# Patient Record
Sex: Female | Born: 1990 | Race: Black or African American | Hispanic: No | Smoking: Current every day smoker
Health system: Southern US, Community
[De-identification: ages and names within clinical notes are randomized; demographics above are authoritative.]

## PROBLEM LIST (undated history)

## (undated) DIAGNOSIS — E119 Type 2 diabetes mellitus without complications: Secondary | ICD-10-CM

## (undated) DIAGNOSIS — O139 Gestational [pregnancy-induced] hypertension without significant proteinuria, unspecified trimester: Secondary | ICD-10-CM

## (undated) DIAGNOSIS — Z5189 Encounter for other specified aftercare: Secondary | ICD-10-CM

---

## 2013-07-07 DIAGNOSIS — O1410 Severe pre-eclampsia, unspecified trimester: Secondary | ICD-10-CM

## 2014-07-31 DIAGNOSIS — O1410 Severe pre-eclampsia, unspecified trimester: Secondary | ICD-10-CM

## 2018-08-03 ENCOUNTER — Encounter (HOSPITAL_COMMUNITY): Payer: Self-pay | Admitting: Emergency Medicine

## 2018-08-03 ENCOUNTER — Other Ambulatory Visit: Payer: Self-pay

## 2018-08-03 ENCOUNTER — Emergency Department (HOSPITAL_COMMUNITY): Payer: Medicaid Other

## 2018-08-03 ENCOUNTER — Emergency Department (HOSPITAL_COMMUNITY)
Admission: EM | Admit: 2018-08-03 | Discharge: 2018-08-03 | Disposition: A | Discharge status: Home / Self Care | Payer: Medicaid Other | Attending: Emergency Medicine | Admitting: Emergency Medicine

## 2018-08-03 DIAGNOSIS — M545 Low back pain, unspecified: Secondary | ICD-10-CM

## 2018-08-03 DIAGNOSIS — R112 Nausea with vomiting, unspecified: Secondary | ICD-10-CM

## 2018-08-03 DIAGNOSIS — R079 Chest pain, unspecified: Secondary | ICD-10-CM | POA: Insufficient documentation

## 2018-08-03 DIAGNOSIS — E119 Type 2 diabetes mellitus without complications: Secondary | ICD-10-CM | POA: Diagnosis not present

## 2018-08-03 DIAGNOSIS — I10 Essential (primary) hypertension: Secondary | ICD-10-CM | POA: Diagnosis not present

## 2018-08-03 DIAGNOSIS — F17210 Nicotine dependence, cigarettes, uncomplicated: Secondary | ICD-10-CM | POA: Diagnosis not present

## 2018-08-03 HISTORY — DX: Type 2 diabetes mellitus without complications: E11.9

## 2018-08-03 LAB — COMPREHENSIVE METABOLIC PANEL
ALT: 24 U/L (ref 0–44)
AST: 25 U/L (ref 15–41)
Albumin: 4.5 g/dL (ref 3.5–5.0)
Alkaline Phosphatase: 106 U/L (ref 38–126)
Anion gap: 16 — ABNORMAL HIGH (ref 5–15)
BUN: 11 mg/dL (ref 6–20)
CO2: 24 mmol/L (ref 22–32)
Calcium: 9.6 mg/dL (ref 8.9–10.3)
Chloride: 97 mmol/L — ABNORMAL LOW (ref 98–111)
Creatinine, Ser: 0.97 mg/dL (ref 0.44–1.00)
GFR calc Af Amer: >60 mL/min (ref >60)
GFR calc non Af Amer: >60 mL/min (ref >60)
Glucose, Bld: 308 mg/dL — ABNORMAL HIGH (ref 70–99)
Potassium: 4.1 mmol/L (ref 3.5–5.1)
Sodium: 137 mmol/L (ref 135–145)
Total Bilirubin: 1 mg/dL (ref 0.3–1.2)
Total Protein: 8.7 g/dL — ABNORMAL HIGH (ref 6.5–8.1)

## 2018-08-03 LAB — URINALYSIS, ROUTINE W REFLEX MICROSCOPIC
Bilirubin Urine: NEGATIVE
Glucose, UA: >=500 mg/dL — AB
Ketones, ur: 5 mg/dL — AB
Nitrite: NEGATIVE
Protein, ur: >=300 mg/dL — AB
Specific Gravity, Urine: 1.022 (ref 1.005–1.030)
pH: 7 (ref 5.0–8.0)

## 2018-08-03 LAB — BLOOD GAS, VENOUS
Acid-Base Excess: 0.1 mmol/L (ref 0.0–2.0)
Bicarbonate: 25.4 mmol/L (ref 20.0–28.0)
O2 Saturation: 70.6 %
Patient temperature: 98.6
pCO2, Ven: 45.7 mmHg (ref 44.0–60.0)
pH, Ven: 7.364 (ref 7.250–7.430)
pO2, Ven: 37.3 mmHg (ref 32.0–45.0)

## 2018-08-03 LAB — CBC WITH DIFFERENTIAL/PLATELET
Abs Immature Granulocytes: 0.03 10*3/uL (ref 0.00–0.07)
Basophils Absolute: 0.1 10*3/uL (ref 0.0–0.1)
Basophils Relative: 0 %
Eosinophils Absolute: 0 10*3/uL (ref 0.0–0.5)
Eosinophils Relative: 0 %
HCT: 45.7 % (ref 36.0–46.0)
Hemoglobin: 15.7 g/dL — ABNORMAL HIGH (ref 12.0–15.0)
Immature Granulocytes: 0 %
Lymphocytes Relative: 10 %
Lymphs Abs: 1.2 10*3/uL (ref 0.7–4.0)
MCH: 28.8 pg (ref 26.0–34.0)
MCHC: 34.4 g/dL (ref 30.0–36.0)
MCV: 83.9 fL (ref 80.0–100.0)
Monocytes Absolute: 0.5 10*3/uL (ref 0.1–1.0)
Monocytes Relative: 4 %
Neutro Abs: 10.4 10*3/uL — ABNORMAL HIGH (ref 1.7–7.7)
Neutrophils Relative %: 86 %
Platelets: 312 10*3/uL (ref 150–400)
RBC: 5.45 MIL/uL — ABNORMAL HIGH (ref 3.87–5.11)
RDW: 11.9 % (ref 11.5–15.5)
WBC: 12.2 10*3/uL — ABNORMAL HIGH (ref 4.0–10.5)
nRBC: 0 % (ref 0.0–0.2)

## 2018-08-03 LAB — ETHANOL: Alcohol, Ethyl (B): <10 mg/dL (ref <10)

## 2018-08-03 LAB — I-STAT BETA HCG BLOOD, ED (MC, WL, AP ONLY): I-stat hCG, quantitative: <5 m[IU]/mL (ref <5)

## 2018-08-03 LAB — LIPASE, BLOOD: Lipase: 24 U/L (ref 11–51)

## 2018-08-03 MED ORDER — HYDROMORPHONE HCL 1 MG/ML IJ SOLN
1.0000 mg | Freq: Once | INTRAMUSCULAR | Status: AC
  Administered 2018-08-03: 09:00:00 1 mg via INTRAVENOUS
  Filled 2018-08-03: qty 1

## 2018-08-03 MED ORDER — ACETAMINOPHEN 500 MG PO TABS: 500.0000 mg | ORAL_TABLET | Freq: Four times a day (QID) | ORAL | 0 refills | Status: DC | PRN

## 2018-08-03 MED ORDER — SODIUM CHLORIDE (PF) 0.9 % IJ SOLN
INTRAMUSCULAR | Status: AC
  Filled 2018-08-03: qty 50

## 2018-08-03 MED ORDER — ONDANSETRON 4 MG PO TBDP: 4.0000 mg | ORAL_TABLET | Freq: Three times a day (TID) | ORAL | 0 refills | Status: DC | PRN

## 2018-08-03 MED ORDER — HYDROMORPHONE HCL 1 MG/ML IJ SOLN
0.5000 mg | Freq: Once | INTRAMUSCULAR | Status: AC
  Administered 2018-08-03: 08:00:00 0.5 mg via INTRAVENOUS
  Filled 2018-08-03: qty 1

## 2018-08-03 MED ORDER — SODIUM CHLORIDE 0.9 % IV BOLUS
1000.0000 mL | Freq: Once | INTRAVENOUS | Status: AC
  Administered 2018-08-03: 10:00:00 1000 mL via INTRAVENOUS

## 2018-08-03 MED ORDER — LIDOCAINE 5 % EX PTCH: 1.0000 | MEDICATED_PATCH | CUTANEOUS | 0 refills | Status: DC

## 2018-08-03 MED ORDER — IOHEXOL 350 MG/ML SOLN
100.0000 mL | Freq: Once | INTRAVENOUS | Status: AC | PRN
  Administered 2018-08-03: 10:00:00 100 mL via INTRAVENOUS

## 2018-08-03 MED ORDER — LORAZEPAM 2 MG/ML IJ SOLN
1.0000 mg | Freq: Once | INTRAMUSCULAR | Status: AC
  Administered 2018-08-03: 1 mg via INTRAVENOUS
  Filled 2018-08-03: qty 1

## 2018-08-03 MED ORDER — ONDANSETRON HCL 4 MG/2ML IJ SOLN
4.0000 mg | Freq: Once | INTRAMUSCULAR | Status: AC
  Administered 2018-08-03: 07:00:00 4 mg via INTRAVENOUS
  Filled 2018-08-03: qty 2

## 2018-08-03 MED ORDER — SODIUM CHLORIDE 0.9 % IV BOLUS
1000.0000 mL | Freq: Once | INTRAVENOUS | Status: AC
  Administered 2018-08-03: 1000 mL via INTRAVENOUS

## 2018-08-03 NOTE — ED Notes (Addendum)
Patient given crackers and water

## 2018-08-03 NOTE — ED Notes (Signed)
Patient states she does not want pain medication right now.   8/10 pain per patient. Sharp in left lower back.

## 2018-08-03 NOTE — ED Provider Notes (Signed)
[facility] DEPT Provider Note   CSN: [id number] Arrival date & time: 08/03/18  0553     History   Chief Complaint Chief Complaint  Patient presents with  . Nausea    HPI Lindsay Burnett is a 28 y.o. female.     Patient presents to the ED with a chief complaint of nausea and vomiting.  She states that she had been drinking with a friend and began to have upset stomach which progressed to vomiting.  She denies any diarrhea.  Denies any focal abdominal pain. Denies fevers, chills.  Denies dysuria.  Denies any other associated symptoms.  Hasn't taken anything for the symptoms.  The history is provided by the patient. No language interpreter was used.    Past Medical History:  Diagnosis Date  . Diabetes mellitus without complication ([facility])     There are no active problems to display for this patient.   Past Surgical History:  Procedure Laterality Date  . CESAREAN SECTION     2 cesarean sections     OB History   No obstetric history on file.      Home Medications    Prior to Admission medications   Not on File    Family History Family History  Problem Relation Age of Onset  . Diabetes Mother   . Diabetes Maternal Grandmother     Social History Social History   Tobacco Use  . Smoking status: Current Every Day Smoker    Packs/day: 0.50    Years: 4.00    Pack years: 2.00  . Smokeless tobacco: Never Used  Substance Use Topics  . Alcohol use: Yes    Comment: 1 drink every 4-5 months  . Drug use: Never     Allergies   Reglan [metoclopramide] and Compazine [prochlorperazine edisylate]   Review of Systems Review of Systems  All other systems reviewed and are negative.    Physical Exam Updated Vital Signs BP (!) 132/114 (BP Location: Right Arm)   Pulse 95   Temp 97.7 F (36.5 C) (Oral)   Resp 20   Ht 5\' 8"  (1.727 m)   Wt 108.9 kg   LMP 06/28/2018 (Approximate)   SpO2 99%   BMI 36.49 kg/m   Physical  Exam Vitals signs and nursing note reviewed.  Constitutional:      General: She is not in acute distress.    Appearance: She is well-developed.  HENT:     Head: Normocephalic and atraumatic.  Eyes:     Conjunctiva/sclera: Conjunctivae normal.  Neck:     Musculoskeletal: Neck supple.  Cardiovascular:     Rate and Rhythm: Normal rate and regular rhythm.     Heart sounds: No murmur.  Pulmonary:     Effort: Pulmonary effort is normal. No respiratory distress.     Breath sounds: Normal breath sounds.  Abdominal:     Palpations: Abdomen is soft.     Tenderness: There is no abdominal tenderness.     Comments: No focal abdominal tenderness, no RLQ tenderness or pain at McBurney's point, no RUQ tenderness or Murphy's sign, no left-sided abdominal tenderness, no fluid wave, or signs of peritonitis   Musculoskeletal: Normal range of motion.  Skin:    General: Skin is warm and dry.  Neurological:     Mental Status: She is alert and oriented to person, place, and time.  Psychiatric:        Mood and Affect: Mood normal.     Comments: Seems intoxicated  ED Treatments / Results  Labs (all labs ordered are listed, but only abnormal results are displayed) Labs Reviewed - No data to display  EKG None  Radiology No results found.  Procedures Procedures (including critical care time)  Medications Ordered in ED Medications  sodium chloride 0.9 % bolus 1,000 mL (has no administration in time range)  ondansetron (ZOFRAN) injection 4 mg (has no administration in time range)     Initial Impression / Assessment and Plan / ED Course  I have reviewed the triage vital signs and the nursing notes.  Pertinent labs & imaging results that were available during my care of the patient were reviewed by me and considered in my medical decision making (see chart for details).        Patient with n/v and abdominal pain.  Seems intoxicated with alcohol.  Will give some fluids and  antiemetics, check labs, and reassess.  Anticipate DC to home.  [facility] [place], who will continue care.  Plan: Follow-up on labs PO challenge Reassess abdomen  Final Clinical Impressions(s) / ED Diagnoses   Final diagnoses:  Non-intractable vomiting with nausea, unspecified vomiting type    ED Discharge Orders    None       Roxy HorsemanBrowning, Ouita Nish, PA-C 08/03/18 [id number]    Marily MemosMesner, Jason, MD 08/03/18 2312

## 2018-08-03 NOTE — ED Notes (Signed)
Attempted to place IV in right wrist but unsuccessful.

## 2018-08-03 NOTE — ED Notes (Signed)
Patient tolerated water with no emesis or nausea.
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
## 2018-08-03 NOTE — ED Notes (Addendum)
Patient had vomited X2 while this RN in room. [place], [place] aware.   Patient aware we need urine sample.

## 2018-08-03 NOTE — Discharge Instructions (Addendum)
Take Zofran as needed for nausea and vomiting.  Let this medicine dissolve under your tongue and wait around 10 to 15 minutes before having anything to eat or drink to give this medicine time to work.  For the next few days eat a diet of bland foods that will not upset your stomach.  Avoid fried foods, spicy foods, fatty foods, or alcohol.  I would stick with mostly clear liquids, broth, and then move onto things like plain chicken and crackers.  Drink plenty of fluids and get plenty of rest.  You can apply Lidoderm patches to the back if you are experiencing back pain.  You can take Tylenol every 6 hours as needed for pain.  Do some gentle stretching to avoid muscle stiffness.  Please follow-up with a primary care provider for reevaluation of your symptoms in the next 3 to 7 days.  Return to the emergency department if any concerning signs or symptoms develop such as high fevers, persistent vomiting, or severe pain.  If your blood pressure (BP) was elevated on multiple readings during this visit above 130 for the top number or above 80 for the bottom number, please have this repeated by your primary care provider within one month. You can also check your blood pressure when you are out at a pharmacy or grocery store. Many have machines that will check your blood pressure.  If your blood pressure remains elevated, please follow-up with your PCP.
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
## 2018-08-03 NOTE — ED Notes (Signed)
Respiratory has been called.  

## 2019-01-12 DIAGNOSIS — E119 Type 2 diabetes mellitus without complications: Secondary | ICD-10-CM | POA: Insufficient documentation

## 2019-04-15 ENCOUNTER — Encounter (INDEPENDENT_AMBULATORY_CARE_PROVIDER_SITE_OTHER): Payer: Medicaid Other | Admitting: Ophthalmology

## 2019-04-23 NOTE — Progress Notes (Shared)
?  [facility] - Clinic Note ? ?04/30/2019 ? ?  ? ?CHIEF COMPLAINT ?Patient presents for No chief complaint on file. ? ? ?HISTORY OF PRESENT ILLNESS: ?Lindsay Burnett is a 29 y.o. female who presents to the clinic today for:  ? ? ? ?Referring physician: ?Olivia Canter, MD ?[address] ?[address] ?[place],  [place] [address] ? ?HISTORICAL INFORMATION:  ? 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 ?CURRENT MEDICATIONS: ?No current outpatient medications on file. (Ophthalmic Drugs)  ? ?No current facility-administered medications for this visit. (Ophthalmic Drugs)  ? ?Current Outpatient Medications (Other)  ?Medication Sig  ?? acetaminophen (TYLENOL) 500 MG tablet Take 1 tablet (500 mg total) by mouth every 6 (six) hours as needed.  ?? insulin aspart protamine- aspart (NOVOLOG MIX 70/30) (70-30) 100 UNIT/ML injection Inject 0-15 Units into the skin 3 (three) times daily. Sliding scale with meals  ?? lidocaine (LIDODERM) 5 % Place 1 patch onto the skin daily. Remove & Discard patch within 12 hours or as directed by MD  ?? ondansetron (ZOFRAN ODT) 4 MG disintegrating tablet Take 1 tablet (4 mg total) by mouth every 8 (eight) hours as needed for nausea or vomiting.  ? ?No current facility-administered medications for this visit. (Other)  ? ? ? ? ?REVIEW OF SYSTEMS: ? ? ? ?ALLERGIES ?Allergies  ?Allergen Reactions  ?? Reglan [Metoclopramide]   ?  "Stroke like symptoms"  ?? Compazine [Prochlorperazine Edisylate]   ?  "Stroke like symptoms"  ? ? ?PAST MEDICAL HISTORY ?Past Medical History:  ?Diagnosis Date  ?? Diabetes mellitus without complication ([facility])   ? ?Past Surgical History:  ?Procedure Laterality Date  ?? CESAREAN SECTION    ? 2 cesarean sections  ? ? ?FAMILY HISTORY ?Family History  ?Problem Relation Age of Onset  ? ? Diabetes Mother   ?? Diabetes Maternal Grandmother   ? ? ?SOCIAL

## 2019-04-30 ENCOUNTER — Encounter (INDEPENDENT_AMBULATORY_CARE_PROVIDER_SITE_OTHER): Payer: Medicaid Other | Admitting: Ophthalmology

## 2019-05-18 ENCOUNTER — Ambulatory Visit (INDEPENDENT_AMBULATORY_CARE_PROVIDER_SITE_OTHER): Payer: Medicaid Other | Admitting: Ophthalmology

## 2019-05-18 ENCOUNTER — Encounter (INDEPENDENT_AMBULATORY_CARE_PROVIDER_SITE_OTHER): Payer: Self-pay | Admitting: Ophthalmology

## 2019-05-18 ENCOUNTER — Other Ambulatory Visit: Payer: Self-pay

## 2019-05-18 DIAGNOSIS — H3581 Retinal edema: Secondary | ICD-10-CM | POA: Diagnosis not present

## 2019-05-18 DIAGNOSIS — H35033 Hypertensive retinopathy, bilateral: Secondary | ICD-10-CM

## 2019-05-18 DIAGNOSIS — E113513 Type 2 diabetes mellitus with proliferative diabetic retinopathy with macular edema, bilateral: Secondary | ICD-10-CM

## 2019-05-18 NOTE — Progress Notes (Signed)
**PT WENT THROUGH TECHNICIAN WORK UP AND IMAGING, BUT LEFT BEFORE BEING SEEN BY MD DUE TO CHILD CARE **PT TO RETURN TOMORROW AT 8AM FOR DILATED EXAM   [facility] - Clinic Note  05/18/2019     CHIEF COMPLAINT Patient presents for Diabetic Eye Exam   HISTORY OF PRESENT ILLNESS: Lindsay Burnett is a 29 y.o. female who presents to the clinic today for:   HPI    Diabetic Eye Exam    Vision is blurred for near.  Associated Symptoms Flashes and Floaters.  Diabetes characteristics include Type 2.  Blood sugar level fluctuates.  Last Blood Glucose 103.  Last A1C 9.2.  I, the attending physician,  performed the HPI with the patient and updated documentation appropriately.          Comments    Pt states distance vision is fine and has no trouble driving or watching television but struggles to read small print.  Patient denies eye pain or discomfort.  Patient has Hx of floaters and fol OU; denies any change or worsening.  Hx of CE/IOL OU (Dr. Lorrin Goodell 2021) No Hx of lasers or injections OU.       Last edited by Rennis Chris, MD on 05/18/2019  1:45 PM. (History)    A1c 10.4% on 1.5.21  Referring physician: No referring provider defined for this encounter.  HISTORICAL INFORMATION:   Selected notes from the MEDICAL RECORD NUMBER Referred by Dr. 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 [facility] [place] OD: 20/50- PH: 20/30  OS: 20/40- PH: 20/30 MRx OD: -1.00+2.00x084 20/25         OS: -0.50+1.00x090 20/30 IOPs 15,17 NS OU    CURRENT MEDICATIONS: No current outpatient medications on file. (Ophthalmic Drugs)   No current facility-administered medications for this visit. (Ophthalmic Drugs)   Current Outpatient Medications (Other)  Medication Sig  . acetaminophen (TYLENOL) 500 MG tablet Take 1 tablet (500 mg total) by mouth every 6 (six) hours as needed.  . insulin aspart protamine- aspart (NOVOLOG MIX 70/30) (70-30) 100 UNIT/ML injection Inject 0-15 Units into the  skin 3 (three) times daily. Sliding scale with meals  . lidocaine (LIDODERM) 5 % Place 1 patch onto the skin daily. Remove & Discard patch within 12 hours or as directed by MD  . ondansetron (ZOFRAN ODT) 4 MG disintegrating tablet Take 1 tablet (4 mg total) by mouth every 8 (eight) hours as needed for nausea or vomiting.   No current facility-administered medications for this visit. (Other)      REVIEW OF SYSTEMS: ROS    Positive for: Endocrine, Eyes   Negative for: Constitutional, Gastrointestinal, Neurological, Skin, Genitourinary, Musculoskeletal, HENT, Cardiovascular, Respiratory, Psychiatric, Allergic/Imm, Heme/Lymph   Last edited by Corrinne Eagle on 05/18/2019  1:36 PM. (History)       ALLERGIES Allergies  Allergen Reactions  . Reglan [Metoclopramide]     "Stroke like symptoms"  . Compazine [Prochlorperazine Edisylate]     "Stroke like symptoms"    PAST MEDICAL HISTORY Past Medical History:  Diagnosis Date  . Diabetes mellitus without complication [facility])    Past Surgical History:  Procedure Laterality Date  . CESAREAN SECTION     2 cesarean sections    FAMILY HISTORY Family History  Problem Relation Age of Onset  . Diabetes Mother   . Diabetes Maternal Grandmother     SOCIAL HISTORY Social History   Tobacco Use  . Smoking status: Current Every Day Smoker    Packs/day:  0.50    Years: 4.00    Pack years: 2.00  . Smokeless tobacco: Never Used  Substance Use Topics  . Alcohol use: Yes    Comment: 1 drink every 4-5 months  . Drug use: Never         OPHTHALMIC EXAM:  Base Eye Exam    Visual Acuity (Snellen - Linear)      Right Left   Dist [place] 20/80 20/25 -2   Dist ph [place] 20/30        Tonometry (Tonopen, 1:33 PM)      Right Left   Pressure 20 23  Squeezing       Pupils      Dark Light Shape React APD   Right 4 3 Round Brisk 0   Left 4 3 Round Brisk 0       Visual Fields      Left Right    Full Full       Extraocular Movement       Right Left    Full Full       Neuro/Psych    Oriented x3: Yes   Mood/Affect: Normal       Dilation    Both eyes: 1.0% Mydriacyl, 2.5% Phenylephrine @ 1:33 PM        Refraction    Wearing Rx      Sphere   Right OTC   Left OTC       Manifest Refraction      Sphere Cylinder Axis Dist [facility]   Right -1.25 +2.00 085 20/30-1   Left -0.50 +1.00 090 20/25-2          IMAGING AND PROCEDURES  Imaging and Procedures for @TODAY @  OCT, Retina - OU - Both Eyes       Right Eye Quality was good. Central Foveal Thickness: 342. Progression has no prior data. Findings include normal foveal contour, no IRF, no SRF, vitreomacular adhesion , intraretinal hyper-reflective material (Diffuse thickening / edema; irregular lamination, focal IRHM and VMA).   Left Eye Quality was good. Central Foveal Thickness: 345. Progression has no prior data. Findings include normal foveal contour, no IRF, no SRF, vitreomacular adhesion , intraretinal hyper-reflective material (Diffuse thickening / edema; irregular lamination, focal IRHM and VMA).   Notes *Images captured and stored on drive  Diagnosis / Impression:  NFP; No IRF/SRF OU Diffuse thickening / edema; irregular lamination, focal IRHM and VMA OU  Clinical management:  See below  Abbreviations: NFP - Normal foveal profile. CME - cystoid macular edema. PED - pigment epithelial detachment. IRF - intraretinal fluid. SRF - subretinal fluid. EZ - ellipsoid zone. ERM - epiretinal membrane. ORA - outer retinal atrophy. ORT - outer retinal tubulation. SRHM - subretinal hyper-reflective material        Fluorescein Angiography Optos (Transit OD)       Right Eye   Progression has no prior data. Early phase findings include vascular perfusion defect, microaneurysm, neovascularization disc. Mid/Late phase findings include leakage, neovascularization disc.   Left Eye   Progression has no prior data. Early phase findings include microaneurysm,  vascular perfusion defect, neovascularization disc. Mid/Late phase findings include leakage, neovascularization disc.   Notes **Images stored on drive**  Impression: PDR OU +NVD OU Late leaking MA OU Scattered areas of capillary drop out / IRMA OU                   ASSESSMENT/PLAN:    ICD-10-CM   1. Proliferative diabetic retinopathy  of both eyes with macular edema associated with type 2 diabetes mellitus ([place])  A63.0160   2. Retinal edema  H35.81 OCT, Retina - OU - Both Eyes  3. Hypertensive retinopathy of both eyes  H35.033 Fluorescein Angiography Optos (Transit OD)    1.  2.  3.  Ophthalmic Meds Ordered this visit:  No orders of the defined types were placed in this encounter.      Return in about 1 day (around 05/19/2019) for Dilated Exam.  There are no Patient Instructions on file for this visit.   Explained the diagnoses, plan, and follow up with the patient and they expressed understanding.  Patient expressed understanding of the importance of proper follow up care.  This document serves as a record of services personally performed by Gardiner Sleeper, MD, PhD. It was created on their behalf by Estill Bakes, COT an ophthalmic technician. The creation of this record is the provider's dictation and/or activities during the visit.    Electronically signed by: Estill Bakes, COT 05/18/19 @ 12:20 AM   This document serves as a record of services personally performed by Gardiner Sleeper, MD, PhD. It was created on their behalf by Ernest Mallick, OA, an ophthalmic assistant. The creation of this record is the provider's dictation and/or activities during the visit.    Electronically signed by: Ernest Mallick, OA 05.11.2021 12:20 AM   Gardiner Sleeper, M.D., Ph.D. Diseases & Surgery of the Retina and Vitreous [facility]  I have reviewed the above documentation for accuracy and completeness, and I agree with the above. Gardiner Sleeper, M.D.,  Ph.D. 05/19/19 12:20 AM    Abbreviations: M myopia (nearsighted); A astigmatism; H hyperopia (farsighted); P presbyopia; Mrx spectacle prescription;  CTL contact lenses; OD right eye; OS left eye; OU both eyes  XT exotropia; ET esotropia; PEK punctate epithelial keratitis; PEE punctate epithelial erosions; DES dry eye syndrome; MGD meibomian gland dysfunction; ATs artificial tears; PFAT's preservative free artificial tears; [place] nuclear sclerotic cataract; PSC posterior subcapsular cataract; ERM epi-retinal membrane; PVD posterior vitreous detachment; RD retinal detachment; DM diabetes mellitus; DR diabetic retinopathy; NPDR non-proliferative diabetic retinopathy; PDR proliferative diabetic retinopathy; CSME clinically significant macular edema; DME diabetic macular edema; dbh dot blot hemorrhages; CWS cotton wool spot; POAG primary open angle glaucoma; C/D cup-to-disc ratio; HVF humphrey visual field; GVF goldmann visual field; OCT optical coherence tomography; IOP intraocular pressure; BRVO Branch retinal vein occlusion; CRVO central retinal vein occlusion; CRAO central retinal artery occlusion; BRAO branch retinal artery occlusion; RT retinal tear; SB scleral buckle; PPV pars plana vitrectomy; VH Vitreous hemorrhage; PRP panretinal laser photocoagulation; IVK intravitreal kenalog; VMT vitreomacular traction; MH Macular hole;  NVD neovascularization of the disc; NVE neovascularization elsewhere; AREDS age related eye disease study; ARMD age related macular degeneration; POAG primary open angle glaucoma; EBMD epithelial/anterior basement membrane dystrophy; ACIOL anterior chamber intraocular lens; IOL intraocular lens; PCIOL posterior chamber intraocular lens; Phaco/IOL phacoemulsification with intraocular lens placement; [place] photorefractive keratectomy; LASIK laser assisted in situ keratomileusis; HTN hypertension; DM diabetes mellitus; COPD chronic obstructive pulmonary disease

## 2019-05-19 ENCOUNTER — Ambulatory Visit (INDEPENDENT_AMBULATORY_CARE_PROVIDER_SITE_OTHER): Payer: Medicaid Other | Admitting: Ophthalmology

## 2019-05-19 ENCOUNTER — Encounter (INDEPENDENT_AMBULATORY_CARE_PROVIDER_SITE_OTHER): Payer: Self-pay | Admitting: Ophthalmology

## 2019-05-19 DIAGNOSIS — H3581 Retinal edema: Secondary | ICD-10-CM | POA: Diagnosis not present

## 2019-05-19 DIAGNOSIS — E113513 Type 2 diabetes mellitus with proliferative diabetic retinopathy with macular edema, bilateral: Secondary | ICD-10-CM

## 2019-05-19 DIAGNOSIS — H35033 Hypertensive retinopathy, bilateral: Secondary | ICD-10-CM | POA: Diagnosis not present

## 2019-05-19 DIAGNOSIS — Q141 Congenital malformation of retina: Secondary | ICD-10-CM

## 2019-05-19 DIAGNOSIS — I10 Essential (primary) hypertension: Secondary | ICD-10-CM | POA: Diagnosis not present

## 2019-05-19 MED ORDER — BEVACIZUMAB CHEMO INJECTION 1.25MG/0.05ML SYRINGE FOR KALEIDOSCOPE
1.2500 mg | INTRAVITREAL | Status: AC | PRN
  Administered 2019-05-19: 1.25 mg via INTRAVITREAL

## 2019-05-19 NOTE — Progress Notes (Signed)
[facility] Note  05/19/2019     CHIEF COMPLAINT Patient presents for Retina Evaluation   HISTORY OF PRESENT ILLNESS: Lindsay Burnett is a 29 y.o. female who presents to the clinic today for:   HPI    Retina Evaluation    In both eyes.  Associated Symptoms Negative for Flashes, Distortion, Pain, Photophobia, Trauma, Jaw Claudication, Fever, Fatigue, Weight Loss, Shoulder/Hip pain, Scalp Tenderness, Glare, Redness, Blind Spot and Floaters.  Context:  near vision.  Treatments tried include no treatments.  I, the attending physician,  performed the HPI with the patient and updated documentation appropriately.          Comments    Pt is here for continuation of exam on 05.11.21, she denies fol, floaters or eye pain       Last edited by Bernarda Caffey, MD on 05/19/2019 12:00 PM. (History)    A1c 10.4% on 01.05.21, pt is here to continue her exam from 05.11.21, pt is here on the referral of Dr. 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 Katy Fitch in January 2021, pt states she has trouble seeing up close, but distance vision seems okay, no hx of laser treatments or injections  Referring physician: Debbra Riding, MD [address] [place],  [place] [address]  HISTORICAL INFORMATION:   Selected notes from the [facility] Referred by Dr. 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 [facility] [place] OD: 20/50- PH: 20/30  OS: 20/40- PH: 20/30 MRx OD: -1.00+2.00x084 20/25         OS: -0.50+1.00x090 20/30 IOPs 15,17 NS OU    CURRENT MEDICATIONS: No current outpatient medications on file. (Ophthalmic Drugs)   No current facility-administered medications for this visit. (Ophthalmic Drugs)   Current Outpatient Medications (Other)  Medication Sig  . Blood Glucose Monitoring Suppl (GLUCOCOM BLOOD GLUCOSE MONITOR) DEVI Used to check Blood sugars 4 times daily. Dx. Code E11.65  . Exenatide ER (BYDUREON) 2 MG PEN Inject into the skin.   Marland Kitchen insulin glargine (LANTUS SOLOSTAR) 100 UNIT/ML Solostar Pen Inject into the skin.  Marland Kitchen insulin lispro (HUMALOG) 100 UNIT/ML KwikPen Use as direction three times daily with meals based on correction scale. MAX TDD 30 units  . Insulin Pen Needle (BD PEN NEEDLE NANO U/F) 32G X 4 MM MISC Use to administer insulin 4 times daily  . acetaminophen (TYLENOL) 500 MG tablet Take 1 tablet (500 mg total) by mouth every 6 (six) hours as needed.  . insulin aspart protamine- aspart (NOVOLOG MIX 70/30) (70-30) 100 UNIT/ML injection Inject 0-15 Units into the skin 3 (three) times daily. Sliding scale with meals  . lidocaine (LIDODERM) 5 % Place 1 patch onto the skin daily. Remove & Discard patch within 12 hours or as directed by MD  . ondansetron (ZOFRAN ODT) 4 MG disintegrating tablet Take 1 tablet (4 mg total) by mouth every 8 (eight) hours as needed for nausea or vomiting.   No current facility-administered medications for this visit. (Other)      REVIEW OF SYSTEMS: ROS    Positive for: Endocrine, Eyes   Negative for: Constitutional, Gastrointestinal, Neurological, Skin, Genitourinary, Musculoskeletal, HENT, Cardiovascular, Respiratory, Psychiatric, Allergic/Imm, Heme/Lymph   Last edited by Bernarda Caffey, MD on 05/19/2019 12:01 PM. (History)       ALLERGIES Allergies  Allergen Reactions  . Reglan [Metoclopramide]     "Stroke like symptoms"  . Compazine [Prochlorperazine Edisylate]     "  Stroke like symptoms"    PAST MEDICAL HISTORY Past Medical History:  Diagnosis Date  . Diabetes mellitus without complication [facility])    Past Surgical History:  Procedure Laterality Date  . CESAREAN SECTION     2 cesarean sections    FAMILY HISTORY Family History  Problem Relation Age of Onset  . Diabetes Mother   . Diabetes Maternal Grandmother     SOCIAL HISTORY Social History   Tobacco Use  . Smoking status: Current Every Day Smoker    Packs/day: 0.50    Years: 4.00    Pack years: 2.00  .  Smokeless tobacco: Never Used  Substance Use Topics  . Alcohol use: Yes    Comment: 1 drink every 4-5 months  . Drug use: Never         OPHTHALMIC EXAM:  Base Eye Exam    Visual Acuity (Snellen - Linear)      Right Left   Dist [place] 20/60 +2 20/25 -1   Dist ph [place] 20/25 -2 NI       Tonometry (Tonopen, 8:22 AM)      Right Left   Pressure 17 14       Pupils      Dark Light Shape React APD   Right 4 2 Round Brisk None   Left 4 2 Round Brisk None       Visual Fields (Counting fingers)      Left Right    Full Full       Extraocular Movement      Right Left    Full, Ortho Full, Ortho       Neuro/Psych    Oriented x3: Yes   Mood/Affect: Normal       Dilation    Both eyes: 1.0% Mydriacyl, 2.5% Phenylephrine @ 8:22 AM        Slit Lamp and Fundus Exam    Slit Lamp Exam      Right Left   Lids/Lashes Mild Meibomian gland dysfunction Mild Meibomian gland dysfunction   Conjunctiva/Sclera Melanosis Melanosis   Cornea well healed cataract wounds, trace Punctate epithelial erosions well healed cataract wounds, trace Punctate epithelial erosions   Anterior Chamber Deep and quiet Deep and quiet   Iris Round and dilated, No NVI Round and moderately dilated to 22mm, No NVI   Lens Posterior chamber intraocular lens Posterior chamber intraocular lens   Vitreous Vitreous syneresis, mild VH settled inferiorly Vitreous syneresis       Fundus Exam      Right Left   Disc Pink and Sharp, fine fibrotic NVD centrally Pink and Sharp, fine early NVD centrally   C/D Ratio 0.4 0.4   Macula Good foveal reflex, mild Epiretinal membrane, scattered IRH/DBH, scattered IRMA/early NV Good foveal reflex, mild Epiretinal membrane, +IRH/DBH, +IRMA/early NV   Vessels Vascular attenuation, Tortuous, scattered IRMA/NVE Vascular attenuation, Tortuous, +IRMA/NVE   Periphery Attached, 360 DBH    Attached, 360 IRH/DBH, scattered IRMA/NV, CHRPE at 0330          IMAGING AND PROCEDURES  Imaging and  Procedures for @TODAY @  Intravitreal Injection, Pharmacologic Agent - OD - Right Eye       Time Out 05/19/2019. 9:47 AM. Confirmed correct patient, procedure, site, and patient consented.   Anesthesia Topical anesthesia was used. Anesthetic medications included Lidocaine 2%, Proparacaine 0.5%.   Procedure Preparation included 5% betadine to ocular surface, eyelid speculum. A supplied needle was used.   Injection:  1.25 mg Bevacizumab (AVASTIN) SOLN  NDC: 02774-128-78, Lot: [id number] , Expiration date: 07/21/2019   Route: Intravitreal, Site: Right Eye, Waste: 0 mL  Post-op Post injection exam found visual acuity of at least counting fingers. The patient tolerated the procedure well. There were no complications. The patient received written and verbal post procedure care education.        Intravitreal Injection, Pharmacologic Agent - OS - Left Eye       Time Out 05/19/2019. 9:48 AM. Confirmed correct patient, procedure, site, and patient consented.   Anesthesia Topical anesthesia was used. Anesthetic medications included Lidocaine 2%, Proparacaine 0.5%.   Procedure Preparation included 5% betadine to ocular surface, eyelid speculum. A (32g) needle was used.   Injection:  1.25 mg Bevacizumab (AVASTIN) SOLN   NDC: 07/19/2019, Lot: [id number] , Expiration date: 08/03/2019   Route: Intravitreal, Site: Left Eye, Waste: 0 mL  Post-op Post injection exam found visual acuity of at least counting fingers. The patient tolerated the procedure well. There were no complications. The patient received written and verbal post procedure care education.         Imaging and Procedures from 5.11.21       OCT, Retina - OU - Both Eyes             Right Eye Quality was good. Central Foveal Thickness: 342. Progression has no prior data. Findings include normal foveal contour, no IRF, no SRF, vitreomacular adhesion , intraretinal hyper-reflective material (Diffuse thickening /  edema; irregular lamination, focal IRHM and VMA).   Left Eye Quality was good. Central Foveal Thickness: 345. Progression has no prior data. Findings include normal foveal contour, no IRF, no SRF, vitreomacular adhesion , intraretinal hyper-reflective material (Diffuse thickening / edema; irregular lamination, focal IRHM and VMA).   Notes *Images captured and stored on drive  Diagnosis / Impression: NFP; No IRF/SRF OU Diffuse thickening / edema; irregular lamination, focal IRHM and VMA OU  Clinical management: See below  Abbreviations: NFP - Normal foveal profile. CME - cystoid macular edema. PED - pigment epithelial detachment. IRF - intraretinal fluid. SRF - subretinal fluid. EZ - ellipsoid zone. ERM - epiretinal membrane. ORA - outer retinal atrophy. ORT - outer retinal tubulation. SRHM - subretinal hyper-reflective material             Fluorescein Angiography Optos (Transit OD)            Right Eye   Progression has no prior data. Early phase findings include vascular perfusion defect, microaneurysm, neovascularization disc. Mid/Late phase findings include leakage, neovascularization disc.   Left Eye   Progression has no prior data. Early phase findings include microaneurysm, vascular perfusion defect, neovascularization disc. Mid/Late phase findings include leakage, neovascularization disc.   Notes **Images stored on drive**  Impression: PDR OU +NVD OU Late leaking MA OU Scattered areas of capillary drop out / IRMA OU           ASSESSMENT/PLAN:    ICD-10-CM   1. Proliferative diabetic retinopathy of both eyes with macular edema associated with type 2 diabetes mellitus (HCC)  08/05/2019 Intravitreal Injection, Pharmacologic Agent - OD - Right Eye    Intravitreal Injection, Pharmacologic Agent - OS - Left Eye    Bevacizumab (AVASTIN) SOLN 1.25 mg    Bevacizumab (AVASTIN) SOLN 1.25 mg  2. Retinal edema  H35.81   3. Essential hypertension  I10    4. Hypertensive retinopathy of both eyes  H35.033   5. 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 Proliferative diabetic  retinopathy, OU  - The incidence, risk factors for progression, natural history and treatment options for diabetic retinopathy  were discussed with patient.     - The need for close monitoring of blood glucose, blood pressure, and serum lipids, avoiding cigarette or any type of tobacco, and the need for long term follow up was also discussed with patient.  - exam shows diffuse 360 IRH/DBH, IRMA/early NVE, and +NVD OU  - FA (05.11.21) shows scattered vascular perfusion defects, late leaking MA, +IRMA/early NVE, +NVD OU  - OCT shows diffuse non-cystic diabetic macular edema, OU  - The natural history, pathology, and characteristics of diabetic macular edema discussed with patient.  A generalized discussion of the major clinical trials concerning treatment of diabetic macular edema (ETDRS, DCT, SCORE, RISE / RIDE, and ongoing DRCR net studies) was completed.  This discussion included mention of the various approaches to treating diabetic macular edema (observation, laser photocoagulation, anti-VEGF injections with lucentis / Avastin / Eylea, steroid injections with Kenalog / Ozurdex, and intraocular surgery with vitrectomy).  The goal hemoglobin A1C of 6-7 was discussed, as well as importance of smoking cessation and hypertension control.  Need for ongoing treatment and monitoring were specifically discussed with reference to chronic nature of diabetic macular edema.  - discussed findings and prognosis and likely treatments needed to stabilize retinopathy  - recommend IVA #1 OU today, 05.12.21 for DME and NV OU  - pt wishes to proceed with IVA OU  - RBA of procedure discussed, questions answered  - IVA informed consent obtained, signed and scanned, 05.12.21  - see procedure note  - will need PRP OU for NV  - f/u in 2-3 wks, DFE, OCT, possible PRP  3,4.  Hypertensive retinopathy OU  - discussed importance of tight BP control  - monitor  5. Pseudophakia OU  - s/p CE/IOL (Dr. Laruth Bouchard)  - beautiful surgery, doing well  - monitor  5. CHRPE OS  - located at 0330  - monitor   Ophthalmic Meds Ordered this visit:  Meds ordered this encounter  Medications  . Bevacizumab (AVASTIN) SOLN 1.25 mg  . Bevacizumab (AVASTIN) SOLN 1.25 mg       Return for f/u 2-3 weeks, PDR OU, DFE, OCT, possible laser.  There are no Patient Instructions on file for this visit.   Explained the diagnoses, plan, and follow up with the patient and they expressed understanding.  Patient expressed understanding of the importance of proper follow up care.  This document serves as a record of services personally performed by Karie Chimera, MD, PhD. It was created on their behalf by Laurian Brim, OA, an ophthalmic assistant. The creation of this record is the provider's dictation and/or activities during the visit.    Electronically signed by: Laurian Brim, OA 05.12.2021 12:19 PM   Karie Chimera, M.D., Ph.D. Diseases & Surgery of the Retina and Vitreous [facility]  I have reviewed the above documentation for accuracy and completeness, and I agree with the above. Karie Chimera, M.D., Ph.D. 05/19/19 12:19 PM   Abbreviations: M myopia (nearsighted); A astigmatism; H hyperopia (farsighted); P presbyopia; Mrx spectacle prescription;  CTL contact lenses; OD right eye; OS left eye; OU both eyes  XT exotropia; ET esotropia; PEK punctate epithelial keratitis; PEE punctate epithelial erosions; DES dry eye syndrome; MGD meibomian gland dysfunction; ATs artificial tears; PFAT's preservative free artificial tears; NSC nuclear sclerotic cataract; PSC posterior subcapsular cataract; ERM epi-retinal membrane; PVD posterior vitreous detachment;  RD retinal detachment; DM diabetes mellitus; DR diabetic retinopathy; NPDR non-proliferative diabetic  retinopathy; PDR proliferative diabetic retinopathy; CSME clinically significant macular edema; DME diabetic macular edema; dbh dot blot hemorrhages; CWS cotton wool spot; POAG primary open angle glaucoma; C/D cup-to-disc ratio; HVF humphrey visual field; GVF goldmann visual field; OCT optical coherence tomography; IOP intraocular pressure; BRVO Branch retinal vein occlusion; CRVO central retinal vein occlusion; CRAO central retinal artery occlusion; BRAO branch retinal artery occlusion; RT retinal tear; SB scleral buckle; PPV pars plana vitrectomy; VH Vitreous hemorrhage; PRP panretinal laser photocoagulation; IVK intravitreal kenalog; VMT vitreomacular traction; MH Macular hole;  NVD neovascularization of the disc; NVE neovascularization elsewhere; AREDS age related eye disease study; ARMD age related macular degeneration; POAG primary open angle glaucoma; EBMD epithelial/anterior basement membrane dystrophy; ACIOL anterior chamber intraocular lens; IOL intraocular lens; PCIOL posterior chamber intraocular lens; Phaco/IOL phacoemulsification with intraocular lens placement; PRK photorefractive keratectomy; LASIK laser assisted in situ keratomileusis; HTN hypertension; DM diabetes mellitus; COPD chronic obstructive pulmonary disease

## 2019-06-14 ENCOUNTER — Encounter (INDEPENDENT_AMBULATORY_CARE_PROVIDER_SITE_OTHER): Payer: Medicaid Other | Admitting: Ophthalmology

## 2019-07-27 ENCOUNTER — Encounter (HOSPITAL_COMMUNITY): Payer: Self-pay

## 2019-07-27 ENCOUNTER — Other Ambulatory Visit: Payer: Self-pay

## 2019-07-27 ENCOUNTER — Ambulatory Visit (HOSPITAL_COMMUNITY)
Admission: EM | Admit: 2019-07-27 | Discharge: 2019-07-27 | Disposition: A | Discharge status: Home / Self Care | Payer: Medicaid Other | Attending: Family Medicine | Admitting: Family Medicine

## 2019-07-27 DIAGNOSIS — Z20822 Contact with and (suspected) exposure to covid-19: Secondary | ICD-10-CM | POA: Insufficient documentation

## 2019-07-27 NOTE — ED Provider Notes (Signed)
[facility]    CSN: [id number] Arrival date & time: 07/27/19  1710      History   Chief Complaint Chief Complaint  Patient presents with  . COVID exposure    HPI Lindsay Burnett is a 29 y.o. female.   HPI Patient states her boyfriend has Covid 33 She started with symptoms today, some scratchy throat and headache She is here with her son with 103 fever She suspects they all have Covid She has no cough or shortness of breath No loss of taste or smell  Patient is obese  Patient is diabetic  If positive she will benefit from an infusion Past Medical History:  Diagnosis Date  . Diabetes mellitus without complication (HCC)     There are no problems to display for this patient.   Past Surgical History:  Procedure Laterality Date  . CESAREAN SECTION     2 cesarean sections    OB History   No obstetric history on file.      Home Medications    Prior to Admission medications   Medication Sig Start Date End Date Taking? Authorizing Provider  acetaminophen (TYLENOL) 500 MG tablet Take 1 tablet (500 mg total) by mouth every 6 (six) hours as needed. 08/03/18   Fawze, Mina A, PA-C  Blood Glucose Monitoring Suppl (GLUCOCOM BLOOD GLUCOSE MONITOR) DEVI Used to check Blood sugars 4 times daily. Dx. Code E11.65 01/27/19   [provider]  Exenatide ER (BYDUREON) 2 MG PEN Inject into the skin. 01/20/19   [provider]  insulin aspart protamine- aspart (NOVOLOG MIX 70/30) (70-30) 100 UNIT/ML injection Inject 0-15 Units into the skin 3 (three) times daily. Sliding scale with meals    [provider]  insulin lispro (HUMALOG) 100 UNIT/ML KwikPen Use as direction three times daily with meals based on correction scale. MAX TDD 30 units 01/12/19   [provider]  Insulin Pen Needle (BD PEN NEEDLE NANO U/F) 32G X 4 MM MISC Use to administer insulin 4 times daily 01/12/19   [provider]  insulin glargine (LANTUS SOLOSTAR) 100  UNIT/ML Solostar Pen Inject into the skin. 01/12/19 07/27/19  [provider]    Family History Family History  Problem Relation Age of Onset  . Diabetes Mother   . Diabetes Maternal Grandmother     Social History Social History   Tobacco Use  . Smoking status: Current Every Day Smoker    Packs/day: 0.50    Years: 4.00    Pack years: 2.00  . Smokeless tobacco: Never Used  Vaping Use  . Vaping Use: Never assessed  Substance Use Topics  . Alcohol use: Yes    Comment: occ  . Drug use: Never     Allergies   Reglan [metoclopramide] and Compazine [prochlorperazine edisylate]   Review of Systems Review of Systems See HPI Physical Exam Triage Vital Signs ED Triage Vitals  Enc Vitals Group     BP 07/27/19 1804 (!) 138/94     Pulse Rate 07/27/19 1804 94     Resp 07/27/19 1804 16     Temp 07/27/19 1804 98.4 F (36.9 C)     Temp Source 07/27/19 1804 Oral     SpO2 07/27/19 1804 100 %     Weight 07/27/19 1805 235 lb (106.6 kg)     Height 07/27/19 1805 5\' 8"  (1.727 m)     Head Circumference --      Peak Flow --      Pain  Score 07/27/19 1805 3     Pain Loc --      Pain Edu? --      Excl. in GC? --    No data found.  Updated Vital Signs BP (!) 138/94   Pulse 94   Temp 98.4 F (36.9 C) (Oral)   Resp 16   Ht 5\' 8"  (1.727 m)   Wt 106.6 kg   SpO2 100%   BMI 35.73 kg/m      Physical Exam Constitutional:      General: She is not in acute distress.    Appearance: She is well-developed. She is obese. She is not ill-appearing or toxic-appearing.  HENT:     Head: Normocephalic and atraumatic.  Eyes:     Conjunctiva/sclera: Conjunctivae normal.     Pupils: Pupils are equal, round, and reactive to light.  Cardiovascular:     Rate and Rhythm: Normal rate.  Pulmonary:     Effort: Pulmonary effort is normal. No respiratory distress.  Musculoskeletal:        General: Normal range of motion.     Cervical back: Normal range of motion.  Skin:    General: Skin  is warm and dry.  Neurological:     Mental Status: She is alert.  Psychiatric:        Mood and Affect: Mood normal.        Behavior: Behavior normal.      UC Treatments / Results  Labs (all labs ordered are listed, but only abnormal results are displayed) Labs Reviewed  SARS CORONAVIRUS 2 (TAT 6-24 HRS)    EKG   Radiology No results found.  Procedures Procedures (including critical care time)  Medications Ordered in UC Medications - No data to display  Initial Impression / Assessment and Plan / UC Course  I have reviewed the triage vital signs and the nursing notes.  Pertinent labs & imaging results that were available during my care of the patient were reviewed by me and considered in my medical decision making (see chart for details).     Reviewed likelihood that the whole family has Covid.  Explained that even of one person is negative for Covid, they will need to be quarantined equally. Final Clinical Impressions(s) / UC Diagnoses   Final diagnoses:  Contact with and (suspected) exposure to covid-19     Discharge Instructions     Go home to rest Drink plenty of fluids Take Tylenol for pain or fever You may take over-the-counter cough and cold medicines as needed You must quarantine at home until your test result is available You can check for your test result in MyChart If you are COVID positive you will need an infusion   ED Prescriptions    None     PDMP not reviewed this encounter.   , MD 07/27/19 2146

## 2019-07-27 NOTE — ED Triage Notes (Signed)
Pt was around boyfriend and he tested COVID +. PT co 3/10 HA, scratchy throat started today. Pt denies dysphagia.

## 2019-07-27 NOTE — Discharge Instructions (Signed)
Go home to rest Drink plenty of fluids Take Tylenol for pain or fever You may take over-the-counter cough and cold medicines as needed You must quarantine at home until your test result is available You can check for your test result in MyChart If you are COVID positive you will need an infusion

## 2019-07-28 LAB — SARS CORONAVIRUS 2 (TAT 6-24 HRS): SARS Coronavirus 2: NEGATIVE

## 2020-02-25 ENCOUNTER — Ambulatory Visit (INDEPENDENT_AMBULATORY_CARE_PROVIDER_SITE_OTHER): Payer: Medicaid Other

## 2020-02-25 DIAGNOSIS — O0990 Supervision of high risk pregnancy, unspecified, unspecified trimester: Secondary | ICD-10-CM

## 2020-02-25 MED ORDER — BLOOD PRESSURE KIT DEVI: 1.0000 | 0 refills | Status: DC | PRN

## 2020-02-25 NOTE — Progress Notes (Signed)
I connected with Lindsay Burnett by telephone and verified that I am speaking with the correct person using two identifiers. 30 minutes was spent with patient on the phone.           Patient: Home  Provider: [facility]   PRENATAL INTAKE SUMMARY  Ms. Lindsay Burnett presents today New OB Nurse Interview.  OB History    Gravida  7   Para  3   Term  1   Preterm  2   AB  3   Living  3     SAB  3   IAB  0   Ectopic  0   Multiple  0   Live Births  3          I have reviewed the patient's medical, obstetrical, social, and family histories, medications, and available lab results.  SUBJECTIVE She has no unusual complaints. PHQ 9 today: 2.  OBJECTIVE Initial Prenatal Interview (New OB)  GENERAL APPEARANCE: alert, well sounding during phone call.    ASSESSMENT Pregnancy complicated by IDDM Type 2 (followed by endocrinology) and history of severe preeclampsia with 2 preterm deliveries  PLAN Blood pressure cuff ordered for patient. Patient to follow up in 1 week for NOB with MD. Pt is currently on insulin given by her endocrinologist. Advised to continue with current regimen.

## 2020-03-03 ENCOUNTER — Ambulatory Visit (INDEPENDENT_AMBULATORY_CARE_PROVIDER_SITE_OTHER): Payer: Medicaid Other

## 2020-03-03 ENCOUNTER — Ambulatory Visit (INDEPENDENT_AMBULATORY_CARE_PROVIDER_SITE_OTHER): Payer: Medicaid Other | Admitting: Obstetrics and Gynecology

## 2020-03-03 ENCOUNTER — Encounter: Payer: Self-pay | Admitting: Obstetrics and Gynecology

## 2020-03-03 ENCOUNTER — Other Ambulatory Visit (HOSPITAL_COMMUNITY)
Admission: RE | Admit: 2020-03-03 | Discharge: 2020-03-03 | Disposition: A | Discharge status: Home / Self Care | Payer: Medicaid Other | Source: Ambulatory Visit | Attending: Obstetrics and Gynecology | Admitting: Obstetrics and Gynecology

## 2020-03-03 ENCOUNTER — Other Ambulatory Visit: Payer: Self-pay

## 2020-03-03 VITALS — BP 135/83 | HR 88 | Wt 267.4 lb

## 2020-03-03 DIAGNOSIS — O0990 Supervision of high risk pregnancy, unspecified, unspecified trimester: Secondary | ICD-10-CM | POA: Diagnosis not present

## 2020-03-03 DIAGNOSIS — Z3A13 13 weeks gestation of pregnancy: Secondary | ICD-10-CM

## 2020-03-03 DIAGNOSIS — O99331 Smoking (tobacco) complicating pregnancy, first trimester: Secondary | ICD-10-CM

## 2020-03-03 DIAGNOSIS — O368390 Maternal care for abnormalities of the fetal heart rate or rhythm, unspecified trimester, not applicable or unspecified: Secondary | ICD-10-CM

## 2020-03-03 DIAGNOSIS — O99211 Obesity complicating pregnancy, first trimester: Secondary | ICD-10-CM

## 2020-03-03 DIAGNOSIS — Z8751 Personal history of pre-term labor: Secondary | ICD-10-CM

## 2020-03-03 DIAGNOSIS — O10919 Unspecified pre-existing hypertension complicating pregnancy, unspecified trimester: Secondary | ICD-10-CM | POA: Diagnosis not present

## 2020-03-03 DIAGNOSIS — O24111 Pre-existing diabetes mellitus, type 2, in pregnancy, first trimester: Secondary | ICD-10-CM

## 2020-03-03 DIAGNOSIS — Z98891 History of uterine scar from previous surgery: Secondary | ICD-10-CM

## 2020-03-03 NOTE — Progress Notes (Signed)
Subjective:    Lindsay Burnett is a [id number] [redacted]w[redacted]d being seen today for her first obstetrical visit.  Her obstetrical history is significant for obesity, pre-eclampsia and smoker, chronic hypertension, DM2 on insulin, hx 2 prior c sections, hx preterm delivery (for PEC). Patient does intend to breast feed. Pregnancy history fully reviewed.  Patient has hx of two preterm deliveries via CS at 40 and 35 weeks. We do not have records for her prior pregnancies but she reports a diagnosis of PreEclampsia with both of these deliveries. She had one full term vaginal delivery prior to these ten years ago. Reports diagnosis of diabetes in 2006. Has been on insulin during her pregnancies and is very comfortable with diabetes mgmt in pregnancy.   Prior to pregnancy was on lisinopril and Ozempic along with insulin. Stopped these medications when she found out she was pregnant. Follows with endocrinology. These notes are in care everywhere.   Patient reports no complaints. Feels well overall. Very mild nausea, much better than in prior pregnancies.   Pregnancy is unplanned but welcomed.   Vitals:   03/03/20 0910  BP: 135/83  Pulse: 88  Weight: 267 lb 6.4 oz (121.3 kg)    HISTORY: OB History  Gravida Para Term Preterm AB Living  7 3 1 2 3 3   SAB IAB Ectopic Multiple Live Births  3 0 0 0 3    # Outcome Date GA Lbr Len/2nd Weight Sex Delivery Anes PTL Lv  7 Current           6 Preterm 07/31/14 [redacted]w[redacted]d  5 lb 4 oz (2.381 kg) F CS-Unspec Spinal N LIV     Complications: Preeclampsia, severe  5 Preterm 07/07/13 [redacted]w[redacted]d  2 lb 5 oz (1.049 kg) F CS-Unspec Spinal Y LIV     Complications: Preeclampsia, severe  4 Term 08/10/09 [redacted]w[redacted]d  6 lb 3 oz (2.807 kg) M Vag-Spont EPI N LIV  3 SAB           2 SAB           1 SAB            Past Medical History:  Diagnosis Date  . Diabetes mellitus without complication [facility])    Past Surgical History:  Procedure Laterality Date  . CESAREAN SECTION     2 cesarean  sections   Family History  Problem Relation Age of Onset  . Diabetes Mother   . Diabetes Maternal Grandmother      Exam    Uterus:     Pelvic Exam:    Perineum: No Hemorrhoids   Vulva: normal   Vagina:  normal mucosa   pH:    Cervix: Normal    Adnexa: normal adnexa      System: Breast:  normal appearance, no masses or tenderness   Skin: normal coloration and turgor, no rashes    Neurologic: oriented, normal   Extremities: normal strength, tone, and muscle mass   HEENT PERRLA   Mouth/Teeth mucous membranes moist, pharynx normal without lesions   Neck supple   Cardiovascular: regular rate and rhythm   Respiratory:  appears well,    Abdomen: soft, non-tender; bowel sounds normal; no masses,  no organomegaly   Urinary: urethral meatus normal      Assessment:    Pregnancy: [facility] Patient Active Problem List   Diagnosis Date Noted  . Supervision of high risk pregnancy, antepartum 02/25/2020  . Type 2 diabetes mellitus (HCC) 01/12/2019  Plan:      1.Supervision of high risk pregnancy:  Initial labs drawn. Prenatal vitamins. Problem list reviewed and updated. Genetic Screening discussrequested.NIPS  Ultrasound discussed; fetal survey: requested.  Follow up in 4 weeks.  2. Chronic HTN -not currently on medications. Enlisted in baby scripts. Obtain baseline P:C, CMP.  -previously on lisinopril, which she discontinued upon finding out she was pregnant -had single elevated Bp at home but otherwise reports BP 120/80.  -discussed possible need for medication and she verbalizes understanding, will monitor for now   3. DM2 affecting pregnancy  -follows with endocrinology. Reports good relationship with her endocrinologist and prefers that they manage her DM during pregnancy. Notes are available in Care Everywhere and per review, her insulin has already been adjusted for pregnancy.  -continue lantus 20 units daily. Humalog 2 units w lunch/dinner plus correction  scale  - Ozempic was discontinued upon finding out she was pregnant. -continue checking CBG. She is aware of BG goals during pregnancy.  -she also follows with opthalmology due to history of retinopathy   4. Tobacco Use Disorder -smokes 1/2 pack per day, working on quitting. Continue to address   5. History of two prior C Sections   Gita Kudo 03/03/2020

## 2020-03-03 NOTE — Progress Notes (Signed)
Patient presents for Initial Prenatal visit. Patient has no concerns today. She had a dating and viability scan at [facility]. Patient states that she had an elevated blood pressure reading this am 160/71. Patient was recently on lisinopril prescribed by her endocrinologist but discontinued when pregnancy was confirmed.

## 2020-03-04 ENCOUNTER — Other Ambulatory Visit: Payer: Self-pay | Admitting: Obstetrics and Gynecology

## 2020-03-04 LAB — COMPREHENSIVE METABOLIC PANEL
ALT: 12 IU/L (ref 0–32)
AST: 14 IU/L (ref 0–40)
Albumin/Globulin Ratio: 1.5 (ref 1.2–2.2)
Albumin: 3.9 g/dL (ref 3.8–4.8)
Alkaline Phosphatase: 90 IU/L (ref 44–121)
BUN/Creatinine Ratio: 14 (ref 9–23)
BUN: 16 mg/dL (ref 6–20)
Bilirubin Total: <0.2 mg/dL (ref 0.0–1.2)
CO2: 21 mmol/L (ref 20–29)
Calcium: 9.8 mg/dL (ref 8.7–10.2)
Chloride: 102 mmol/L (ref 96–106)
Creatinine, Ser: 1.17 mg/dL — ABNORMAL HIGH (ref 0.57–1.00)
GFR calc Af Amer: 72 mL/min/{1.73_m2} (ref >59)
GFR calc non Af Amer: 62 mL/min/{1.73_m2} (ref >59)
Globulin, Total: 2.6 g/dL (ref 1.5–4.5)
Glucose: 128 mg/dL — ABNORMAL HIGH (ref 65–99)
Potassium: 4.8 mmol/L (ref 3.5–5.2)
Sodium: 140 mmol/L (ref 134–144)
Total Protein: 6.5 g/dL (ref 6.0–8.5)

## 2020-03-04 LAB — HEMOGLOBIN A1C
Est. average glucose Bld gHb Est-mCnc: 131 mg/dL
Hgb A1c MFr Bld: 6.2 % — ABNORMAL HIGH (ref 4.8–5.6)

## 2020-03-04 LAB — CBC/D/PLT+RPR+RH+ABO+RUB AB...
Antibody Screen: NEGATIVE
Basophils Absolute: 0.1 10*3/uL (ref 0.0–0.2)
Basos: 1 %
EOS (ABSOLUTE): 0.1 10*3/uL (ref 0.0–0.4)
Eos: 1 %
HCV Ab: <0.1 s/co ratio (ref 0.0–0.9)
HIV Screen 4th Generation wRfx: NONREACTIVE
Hematocrit: 34.5 % (ref 34.0–46.6)
Hemoglobin: 11.9 g/dL (ref 11.1–15.9)
Hepatitis B Surface Ag: NEGATIVE
Immature Grans (Abs): 0 10*3/uL (ref 0.0–0.1)
Immature Granulocytes: 0 %
Lymphocytes Absolute: 2.4 10*3/uL (ref 0.7–3.1)
Lymphs: 25 %
MCH: 29 pg (ref 26.6–33.0)
MCHC: 34.5 g/dL (ref 31.5–35.7)
MCV: 84 fL (ref 79–97)
Monocytes Absolute: 0.5 10*3/uL (ref 0.1–0.9)
Monocytes: 5 %
Neutrophils Absolute: 6.6 10*3/uL (ref 1.4–7.0)
Neutrophils: 68 %
Platelets: 244 10*3/uL (ref 150–450)
RBC: 4.11 x10E6/uL (ref 3.77–5.28)
RDW: 12 % (ref 11.7–15.4)
RPR Ser Ql: NONREACTIVE
Rh Factor: POSITIVE
Rubella Antibodies, IGG: 1.23 index (ref >0.99)
WBC: 9.6 10*3/uL (ref 3.4–10.8)

## 2020-03-04 LAB — HCV INTERPRETATION

## 2020-03-04 LAB — THYROID CASCADE PROFILE: TSH: 1.55 u[IU]/mL (ref 0.450–4.500)

## 2020-03-04 NOTE — Progress Notes (Signed)
error 

## 2020-03-06 LAB — CERVICOVAGINAL ANCILLARY ONLY
Chlamydia: NEGATIVE
Comment: NEGATIVE
Comment: NEGATIVE
Comment: NORMAL
Neisseria Gonorrhea: NEGATIVE
Trichomonas: POSITIVE — AB

## 2020-03-06 LAB — CYTOLOGY - PAP: Diagnosis: NEGATIVE

## 2020-03-07 ENCOUNTER — Other Ambulatory Visit: Payer: Self-pay

## 2020-03-07 DIAGNOSIS — A599 Trichomoniasis, unspecified: Secondary | ICD-10-CM

## 2020-03-07 LAB — CULTURE, OB URINE

## 2020-03-07 LAB — URINE CULTURE, OB REFLEX

## 2020-03-07 MED ORDER — METRONIDAZOLE 500 MG PO TABS: ORAL_TABLET | ORAL | 0 refills | Status: DC

## 2020-03-10 ENCOUNTER — Other Ambulatory Visit: Payer: Self-pay | Admitting: Obstetrics and Gynecology

## 2020-03-20 ENCOUNTER — Encounter: Payer: Self-pay | Admitting: Obstetrics and Gynecology

## 2020-03-28 ENCOUNTER — Encounter: Payer: Self-pay | Admitting: Medical

## 2020-03-28 ENCOUNTER — Ambulatory Visit (INDEPENDENT_AMBULATORY_CARE_PROVIDER_SITE_OTHER): Payer: Medicaid Other | Admitting: Medical

## 2020-03-28 ENCOUNTER — Other Ambulatory Visit: Payer: Self-pay

## 2020-03-28 VITALS — BP 129/85 | HR 92 | Wt 274.0 lb

## 2020-03-28 DIAGNOSIS — Z794 Long term (current) use of insulin: Secondary | ICD-10-CM

## 2020-03-28 DIAGNOSIS — O0990 Supervision of high risk pregnancy, unspecified, unspecified trimester: Secondary | ICD-10-CM

## 2020-03-28 DIAGNOSIS — O34219 Maternal care for unspecified type scar from previous cesarean delivery: Secondary | ICD-10-CM | POA: Insufficient documentation

## 2020-03-28 DIAGNOSIS — E119 Type 2 diabetes mellitus without complications: Secondary | ICD-10-CM

## 2020-03-28 DIAGNOSIS — Z3A16 16 weeks gestation of pregnancy: Secondary | ICD-10-CM

## 2020-03-28 DIAGNOSIS — O09892 Supervision of other high risk pregnancies, second trimester: Secondary | ICD-10-CM | POA: Insufficient documentation

## 2020-03-28 DIAGNOSIS — Z8759 Personal history of other complications of pregnancy, childbirth and the puerperium: Secondary | ICD-10-CM | POA: Insufficient documentation

## 2020-03-28 MED ORDER — ASPIRIN EC 81 MG PO TBEC: 81.0000 mg | DELAYED_RELEASE_TABLET | Freq: Every day | ORAL | 11 refills | Status: DC

## 2020-03-28 NOTE — Progress Notes (Signed)
ROB, reports no problems today. Patient will sign BTL at next visit.

## 2020-03-28 NOTE — Patient Instructions (Signed)

## 2020-03-28 NOTE — Progress Notes (Signed)
   PRENATAL VISIT NOTE  Subjective:  Lindsay Burnett is a 30 y.o. [phone] at [redacted]w[redacted]d being seen today for ongoing prenatal care.  She is currently monitored for the following issues for this high-risk pregnancy and has Type 2 diabetes mellitus (HCC); Supervision of high risk pregnancy, antepartum; History of severe pre-eclampsia; History of preterm delivery, currently pregnant in second trimester; and Previous cesarean delivery affecting pregnancy, antepartum on their problem list.  Patient reports occasional headache, none currently.  Contractions: Not present. Vag. Bleeding: None.   . Denies leaking of fluid.   The following portions of the patient's history were reviewed and updated as appropriate: allergies, current medications, past family history, past medical history, past social history, past surgical history and problem list.   Objective:   Vitals:   03/28/20 1016 03/28/20 1019  BP: 135/84 129/85  Pulse: 92 92  Weight: 274 lb (124.3 kg)     Fetal Status: Fetal Heart Rate (bpm): 156         General:  Alert, oriented and cooperative. Patient is in no acute distress.  Skin: Skin is warm and dry. No rash noted.   Cardiovascular: Normal heart rate noted  Respiratory: Normal respiratory effort, no problems with respiration noted  Abdomen: Soft, gravid, appropriate for gestational age.  Pain/Pressure: Absent     Pelvic: Cervical exam deferred        Extremities: Normal range of motion.  Edema: None  Mental Status: Normal mood and affect. Normal behavior. Normal judgment and thought content.   Assessment and Plan:  Pregnancy: [id number] at [redacted]w[redacted]d 1. Supervision of high risk pregnancy, antepartum - AFP, Serum, Open Spina Bifida - Genetic Screening - repeat of panorama due to insufficient fetal fraction with last sample - Anatomy US scheduled 4/8  2. Type 2 diabetes mellitus without complication, with long-term current use of insulin (HCC) - Basic Metabolic Panel (BMET) - baseline  drawn today  - Continuous glucose monitoring device, no log available, current CBG is 120, phone app will show trend for the day with values obtained q 5 minutes, but does not keep daily logs - Asked patient to sign ROI at Endocrinology so that we can review records and glucose monitoring  - Currently on sliding scale Humalog and 23 units Lantus at bedtime without issue   3. History of severe pre-eclampsia - aspirin EC 81 MG tablet; Take 1 tablet (81 mg total) by mouth daily. Swallow whole.  Dispense: 30 tablet; Refill: 11  4. History of preterm delivery, currently pregnant in second trimester - Due to PEC  5. [redacted] weeks gestation of pregnancy  6. Previous cesarean delivery affecting pregnancy, antepartum - Will need repeat scheduled at 39 weeks or sooner depending on BP and glycemic control, patient aware - Considering BTL at time of C/S  Preterm labor/ second trimester symptoms and general obstetric precautions including but not limited to vaginal bleeding, contractions, leaking of fluid and fetal movement were reviewed in detail with the patient. Please refer to After Visit Summary for other counseling recommendations.   Return in about 4 weeks (around 04/25/2020) for [facility] MD only, In-Person.  Future Appointments  Date Time Provider Department Center  04/14/2020  8:15 AM [facility] US2 [facility]  05/02/2020  9:30 AM Constant, Gigi Gin, MD [facility] None    Vonzella Nipple, PA-C

## 2020-03-30 LAB — BASIC METABOLIC PANEL
BUN/Creatinine Ratio: 16 (ref 9–23)
BUN: 13 mg/dL (ref 6–20)
CO2: 19 mmol/L — ABNORMAL LOW (ref 20–29)
Calcium: 9.2 mg/dL (ref 8.7–10.2)
Chloride: 102 mmol/L (ref 96–106)
Creatinine, Ser: 0.83 mg/dL (ref 0.57–1.00)
Glucose: 86 mg/dL (ref 65–99)
Potassium: 4.1 mmol/L (ref 3.5–5.2)
Sodium: 139 mmol/L (ref 134–144)
eGFR: 97 mL/min/{1.73_m2} (ref >59)

## 2020-03-30 LAB — AFP, SERUM, OPEN SPINA BIFIDA
AFP MoM: 1.5
AFP Value: 33 ng/mL
Gest. Age on Collection Date: 16.4 wk
Maternal Age At EDD: 32.3 a
OSBR Risk 1 IN: 1632
Test Results:: NEGATIVE
Weight: 274 [lb_av]

## 2020-03-31 ENCOUNTER — Encounter: Payer: Medicaid Other | Admitting: Obstetrics and Gynecology

## 2020-04-05 ENCOUNTER — Encounter: Payer: Self-pay | Admitting: Medical

## 2020-04-14 ENCOUNTER — Other Ambulatory Visit: Payer: Self-pay

## 2020-04-14 ENCOUNTER — Other Ambulatory Visit: Payer: Self-pay | Admitting: Obstetrics and Gynecology

## 2020-04-14 ENCOUNTER — Ambulatory Visit: Payer: Medicaid Other | Attending: Obstetrics and Gynecology

## 2020-04-14 ENCOUNTER — Other Ambulatory Visit: Payer: Self-pay | Admitting: *Deleted

## 2020-04-14 DIAGNOSIS — O0990 Supervision of high risk pregnancy, unspecified, unspecified trimester: Secondary | ICD-10-CM | POA: Diagnosis not present

## 2020-04-14 DIAGNOSIS — O24112 Pre-existing diabetes mellitus, type 2, in pregnancy, second trimester: Secondary | ICD-10-CM

## 2020-05-02 ENCOUNTER — Other Ambulatory Visit (HOSPITAL_COMMUNITY)
Admission: RE | Admit: 2020-05-02 | Discharge: 2020-05-02 | Disposition: A | Discharge status: Home / Self Care | Payer: Medicaid Other | Source: Ambulatory Visit | Attending: Obstetrics and Gynecology | Admitting: Obstetrics and Gynecology

## 2020-05-02 ENCOUNTER — Other Ambulatory Visit: Payer: Self-pay

## 2020-05-02 ENCOUNTER — Ambulatory Visit (INDEPENDENT_AMBULATORY_CARE_PROVIDER_SITE_OTHER): Payer: Medicaid Other | Admitting: Obstetrics and Gynecology

## 2020-05-02 ENCOUNTER — Encounter: Payer: Self-pay | Admitting: Obstetrics and Gynecology

## 2020-05-02 VITALS — BP 148/83 | HR 103 | Wt 283.0 lb

## 2020-05-02 DIAGNOSIS — O24319 Unspecified pre-existing diabetes mellitus in pregnancy, unspecified trimester: Secondary | ICD-10-CM | POA: Insufficient documentation

## 2020-05-02 DIAGNOSIS — Z8759 Personal history of other complications of pregnancy, childbirth and the puerperium: Secondary | ICD-10-CM

## 2020-05-02 DIAGNOSIS — O0990 Supervision of high risk pregnancy, unspecified, unspecified trimester: Secondary | ICD-10-CM | POA: Insufficient documentation

## 2020-05-02 DIAGNOSIS — O119 Pre-existing hypertension with pre-eclampsia, unspecified trimester: Secondary | ICD-10-CM | POA: Insufficient documentation

## 2020-05-02 DIAGNOSIS — O09892 Supervision of other high risk pregnancies, second trimester: Secondary | ICD-10-CM

## 2020-05-02 DIAGNOSIS — O34219 Maternal care for unspecified type scar from previous cesarean delivery: Secondary | ICD-10-CM

## 2020-05-02 DIAGNOSIS — O10919 Unspecified pre-existing hypertension complicating pregnancy, unspecified trimester: Secondary | ICD-10-CM

## 2020-05-02 NOTE — Progress Notes (Signed)
Pt left w/o leaving urine

## 2020-05-02 NOTE — Progress Notes (Signed)
+   Fetal movement. No complaints. TOC for trich today.

## 2020-05-02 NOTE — Progress Notes (Signed)
   PRENATAL VISIT NOTE  Subjective:  Lindsay Burnett is a 30 y.o. [phone] at [redacted]w[redacted]d being seen today for ongoing prenatal care.  She is currently monitored for the following issues for this high-risk pregnancy and has Type 2 diabetes mellitus (HCC); Supervision of high risk pregnancy, antepartum; History of severe pre-eclampsia; History of preterm delivery, currently pregnant in second trimester; Previous cesarean delivery affecting pregnancy, antepartum; Pre-existing diabetes mellitus affecting pregnancy, antepartum; and Chronic hypertension affecting pregnancy on their problem list.  Patient reports no complaints.  Contractions: Not present. Vag. Bleeding: None.  Movement: Present. Denies leaking of fluid.   The following portions of the patient's history were reviewed and updated as appropriate: allergies, current medications, past family history, past medical history, past social history, past surgical history and problem list.   Objective:   Vitals:   05/02/20 0923  BP: (!) 155/77  Pulse: (!) 103  Weight: 283 lb (128.4 kg)    Fetal Status: Fetal Heart Rate (bpm): + on [place]   Movement: Present     General:  Alert, oriented and cooperative. Patient is in no acute distress.  Skin: Skin is warm and dry. No rash noted.   Cardiovascular: Normal heart rate noted  Respiratory: Normal respiratory effort, no problems with respiration noted  Abdomen: Soft, gravid, appropriate for gestational age.  Pain/Pressure: Absent     Pelvic: Cervical exam deferred        Extremities: Normal range of motion.  Edema: None  Mental Status: Normal mood and affect. Normal behavior. Normal judgment and thought content.   Assessment and Plan:  Pregnancy: [id number] at [redacted]w[redacted]d 1. Supervision of high risk pregnancy, antepartum Patient is doing well without complaints  2. Previous cesarean delivery affecting pregnancy, antepartum Will be scheduled for repeat  3. History of severe pre-eclampsia Elevated BP today  without symptoms will continue to monitor closely  4. History of preterm delivery, currently pregnant in second trimester   5. Pre-existing diabetes mellitus affecting pregnancy, antepartum Patient scheduled to meet with endocrinologist today Reports fasting as high as 108 typically in the low 90's and pp as high as 150, typically in the 130's Reviewed diet with the patient and CBG goals Follow up ultrasound scheduled Fetal echo scheduled in June  6. Chronic hypertension affecting pregnancy Previously on lisinopril which was discontinued at the start of pregnancy Will monitor BP closely and start labetalol as needed Continue ASA  Preterm labor symptoms and general obstetric precautions including but not limited to vaginal bleeding, contractions, leaking of fluid and fetal movement were reviewed in detail with the patient. Please refer to After Visit Summary for other counseling recommendations.   Return in about 4 weeks (around 05/30/2020) for in person, ROB, High risk.  Future Appointments  Date Time Provider Department Center  05/09/2020  1:45 PM [facility] NURSE [facility]  05/09/2020  2:00 PM [facility] US1 [facility]    Catalina Antigua, MD

## 2020-05-03 LAB — CERVICOVAGINAL ANCILLARY ONLY
Chlamydia: NEGATIVE
Comment: NEGATIVE
Comment: NEGATIVE
Comment: NORMAL
Neisseria Gonorrhea: NEGATIVE
Trichomonas: POSITIVE — AB

## 2020-05-04 MED ORDER — METRONIDAZOLE 500 MG PO TABS: 500.0000 mg | ORAL_TABLET | Freq: Two times a day (BID) | ORAL | 0 refills | Status: DC

## 2020-05-04 NOTE — Addendum Note (Signed)
Addended by: Catalina Antigua on: 05/04/2020 04:33 PM   Modules accepted: Orders

## 2020-05-09 ENCOUNTER — Other Ambulatory Visit: Payer: Self-pay

## 2020-05-09 ENCOUNTER — Other Ambulatory Visit: Payer: Self-pay | Admitting: *Deleted

## 2020-05-09 ENCOUNTER — Ambulatory Visit: Payer: Medicaid Other | Attending: Obstetrics

## 2020-05-09 ENCOUNTER — Encounter: Payer: Self-pay | Admitting: *Deleted

## 2020-05-09 ENCOUNTER — Ambulatory Visit: Payer: Medicaid Other | Admitting: *Deleted

## 2020-05-09 DIAGNOSIS — O0990 Supervision of high risk pregnancy, unspecified, unspecified trimester: Secondary | ICD-10-CM | POA: Insufficient documentation

## 2020-05-09 DIAGNOSIS — O09892 Supervision of other high risk pregnancies, second trimester: Secondary | ICD-10-CM | POA: Diagnosis present

## 2020-05-09 DIAGNOSIS — O09292 Supervision of pregnancy with other poor reproductive or obstetric history, second trimester: Secondary | ICD-10-CM

## 2020-05-09 DIAGNOSIS — O09212 Supervision of pregnancy with history of pre-term labor, second trimester: Secondary | ICD-10-CM

## 2020-05-09 DIAGNOSIS — Z363 Encounter for antenatal screening for malformations: Secondary | ICD-10-CM

## 2020-05-09 DIAGNOSIS — O10919 Unspecified pre-existing hypertension complicating pregnancy, unspecified trimester: Secondary | ICD-10-CM | POA: Insufficient documentation

## 2020-05-09 DIAGNOSIS — O24112 Pre-existing diabetes mellitus, type 2, in pregnancy, second trimester: Secondary | ICD-10-CM | POA: Diagnosis not present

## 2020-05-09 DIAGNOSIS — Z3A22 22 weeks gestation of pregnancy: Secondary | ICD-10-CM

## 2020-05-09 DIAGNOSIS — O34219 Maternal care for unspecified type scar from previous cesarean delivery: Secondary | ICD-10-CM

## 2020-05-09 DIAGNOSIS — O24319 Unspecified pre-existing diabetes mellitus in pregnancy, unspecified trimester: Secondary | ICD-10-CM | POA: Insufficient documentation

## 2020-05-11 ENCOUNTER — Telehealth: Payer: Self-pay

## 2020-05-11 NOTE — Telephone Encounter (Signed)
Pt called asking what medications she could take for a cold. States she has been taking benadryl for the last week without relief. Advised patient that she could try tylenol based medications for sinus or cold or mucinex plain if she develops a cough as well as plain robitussin. If she thinks it more allergy related claritin or zyrtec. Pt agreed and verbalized understanding.

## 2020-05-30 ENCOUNTER — Encounter: Payer: Self-pay | Admitting: Obstetrics and Gynecology

## 2020-05-30 ENCOUNTER — Other Ambulatory Visit: Payer: Self-pay

## 2020-05-30 ENCOUNTER — Ambulatory Visit (INDEPENDENT_AMBULATORY_CARE_PROVIDER_SITE_OTHER): Payer: Medicaid Other | Admitting: Obstetrics and Gynecology

## 2020-05-30 VITALS — BP 129/85 | HR 94 | Wt 272.0 lb

## 2020-05-30 DIAGNOSIS — Z8759 Personal history of other complications of pregnancy, childbirth and the puerperium: Secondary | ICD-10-CM

## 2020-05-30 DIAGNOSIS — O0990 Supervision of high risk pregnancy, unspecified, unspecified trimester: Secondary | ICD-10-CM

## 2020-05-30 DIAGNOSIS — O34219 Maternal care for unspecified type scar from previous cesarean delivery: Secondary | ICD-10-CM

## 2020-05-30 DIAGNOSIS — O10919 Unspecified pre-existing hypertension complicating pregnancy, unspecified trimester: Secondary | ICD-10-CM

## 2020-05-30 DIAGNOSIS — O09892 Supervision of other high risk pregnancies, second trimester: Secondary | ICD-10-CM

## 2020-05-30 DIAGNOSIS — O24319 Unspecified pre-existing diabetes mellitus in pregnancy, unspecified trimester: Secondary | ICD-10-CM

## 2020-05-30 MED ORDER — METRONIDAZOLE 50 MG/ML ORAL SUSPENSION: 500.0000 mg | Freq: Two times a day (BID) | ORAL | 0 refills | Status: DC

## 2020-05-30 NOTE — Progress Notes (Signed)
   PRENATAL VISIT NOTE  Subjective:  Lindsay Burnett is a 30 y.o. [phone] at [redacted]w[redacted]d being seen today for ongoing prenatal care.  She is currently monitored for the following issues for this high-risk pregnancy and has Type 2 diabetes mellitus (HCC); Supervision of high risk pregnancy, antepartum; History of severe pre-eclampsia; History of preterm delivery, currently pregnant in second trimester; Previous cesarean delivery affecting pregnancy, antepartum; Pre-existing diabetes mellitus affecting pregnancy, antepartum; and Chronic hypertension affecting pregnancy on their problem list.  Patient reports no complaints.  Contractions: Not present. Vag. Bleeding: None.  Movement: Present. Denies leaking of fluid.   The following portions of the patient's history were reviewed and updated as appropriate: allergies, current medications, past family history, past medical history, past social history, past surgical history and problem list.   Objective:   Vitals:   05/30/20 1352  BP: 129/85  Pulse: 94  Weight: 272 lb (123.4 kg)    Fetal Status:     Movement: Present     General:  Alert, oriented and cooperative. Patient is in no acute distress.  Skin: Skin is warm and dry. No rash noted.   Cardiovascular: Normal heart rate noted  Respiratory: Normal respiratory effort, no problems with respiration noted  Abdomen: Soft, gravid, appropriate for gestational age.  Pain/Pressure: Absent     Pelvic: Cervical exam deferred        Extremities: Normal range of motion.  Edema: None  Mental Status: Normal mood and affect. Normal behavior. Normal judgment and thought content.   Assessment and Plan:  Pregnancy: [id number] at [redacted]w[redacted]d 1. Supervision of high risk pregnancy, antepartum Patient is doing well Third trimester labs today Trichomonas infection at end of April. Patient unable to tolerate Flagyl tablets- rx for solution provided  2. Chronic hypertension affecting pregnancy Normotensive today without  medication and asymptomatic Follow up growth ultrasound 6/1  3. Pre-existing diabetes mellitus affecting pregnancy, antepartum CBGs reviewed and great majority within range Patient recently seen by endocrinologist  4. Previous cesarean delivery affecting pregnancy, antepartum Will be scheduled for repeat c-section with BTL  5. History of severe pre-eclampsia   6. History of preterm delivery, currently pregnant in second trimester Secondary to pre-eclampsia  Preterm labor symptoms and general obstetric precautions including but not limited to vaginal bleeding, contractions, leaking of fluid and fetal movement were reviewed in detail with the patient. Please refer to After Visit Summary for other counseling recommendations.   Return in about 2 weeks (around 06/13/2020) for in person, ROB, High risk.  Future Appointments  Date Time Provider Department Center  06/07/2020  9:15 AM [facility] NURSE [facility]  06/07/2020  9:30 AM [facility] US3 [facility]    Catalina Antigua, MD

## 2020-05-31 LAB — HIV ANTIBODY (ROUTINE TESTING W REFLEX): HIV Screen 4th Generation wRfx: NONREACTIVE

## 2020-05-31 LAB — CBC
Hematocrit: 31.1 % — ABNORMAL LOW (ref 34.0–46.6)
Hemoglobin: 10.5 g/dL — ABNORMAL LOW (ref 11.1–15.9)
MCH: 29.2 pg (ref 26.6–33.0)
MCHC: 33.8 g/dL (ref 31.5–35.7)
MCV: 86 fL (ref 79–97)
Platelets: 220 10*3/uL (ref 150–450)
RBC: 3.6 x10E6/uL — ABNORMAL LOW (ref 3.77–5.28)
RDW: 13 % (ref 11.7–15.4)
WBC: 8.8 10*3/uL (ref 3.4–10.8)

## 2020-05-31 LAB — RPR: RPR Ser Ql: NONREACTIVE

## 2020-06-07 ENCOUNTER — Ambulatory Visit: Payer: Medicaid Other

## 2020-06-13 ENCOUNTER — Ambulatory Visit: Payer: Medicaid Other | Admitting: *Deleted

## 2020-06-13 ENCOUNTER — Other Ambulatory Visit: Payer: Self-pay

## 2020-06-13 ENCOUNTER — Ambulatory Visit: Payer: Medicaid Other | Attending: Obstetrics

## 2020-06-13 ENCOUNTER — Encounter: Payer: Self-pay | Admitting: *Deleted

## 2020-06-13 DIAGNOSIS — O10919 Unspecified pre-existing hypertension complicating pregnancy, unspecified trimester: Secondary | ICD-10-CM

## 2020-06-13 DIAGNOSIS — O24112 Pre-existing diabetes mellitus, type 2, in pregnancy, second trimester: Secondary | ICD-10-CM | POA: Diagnosis present

## 2020-06-13 DIAGNOSIS — E119 Type 2 diabetes mellitus without complications: Secondary | ICD-10-CM

## 2020-06-13 DIAGNOSIS — O0990 Supervision of high risk pregnancy, unspecified, unspecified trimester: Secondary | ICD-10-CM

## 2020-06-13 DIAGNOSIS — O34219 Maternal care for unspecified type scar from previous cesarean delivery: Secondary | ICD-10-CM | POA: Diagnosis not present

## 2020-06-13 DIAGNOSIS — O09212 Supervision of pregnancy with history of pre-term labor, second trimester: Secondary | ICD-10-CM

## 2020-06-13 DIAGNOSIS — Z8759 Personal history of other complications of pregnancy, childbirth and the puerperium: Secondary | ICD-10-CM

## 2020-06-13 DIAGNOSIS — O24319 Unspecified pre-existing diabetes mellitus in pregnancy, unspecified trimester: Secondary | ICD-10-CM | POA: Insufficient documentation

## 2020-06-13 DIAGNOSIS — O09892 Supervision of other high risk pregnancies, second trimester: Secondary | ICD-10-CM

## 2020-06-13 DIAGNOSIS — Z794 Long term (current) use of insulin: Secondary | ICD-10-CM

## 2020-06-13 DIAGNOSIS — Z3A27 27 weeks gestation of pregnancy: Secondary | ICD-10-CM

## 2020-06-13 DIAGNOSIS — O321XX0 Maternal care for breech presentation, not applicable or unspecified: Secondary | ICD-10-CM

## 2020-06-13 DIAGNOSIS — Z362 Encounter for other antenatal screening follow-up: Secondary | ICD-10-CM | POA: Diagnosis not present

## 2020-06-14 ENCOUNTER — Other Ambulatory Visit: Payer: Self-pay | Admitting: *Deleted

## 2020-06-14 DIAGNOSIS — O24112 Pre-existing diabetes mellitus, type 2, in pregnancy, second trimester: Secondary | ICD-10-CM

## 2020-06-20 ENCOUNTER — Encounter: Payer: Self-pay | Admitting: Pediatrics

## 2020-06-23 ENCOUNTER — Other Ambulatory Visit: Payer: Self-pay

## 2020-06-23 ENCOUNTER — Ambulatory Visit (INDEPENDENT_AMBULATORY_CARE_PROVIDER_SITE_OTHER): Payer: Medicaid Other | Admitting: Obstetrics and Gynecology

## 2020-06-23 ENCOUNTER — Encounter: Payer: Self-pay | Admitting: Obstetrics and Gynecology

## 2020-06-23 VITALS — BP 130/83 | HR 90 | Wt 272.0 lb

## 2020-06-23 DIAGNOSIS — O10919 Unspecified pre-existing hypertension complicating pregnancy, unspecified trimester: Secondary | ICD-10-CM

## 2020-06-23 DIAGNOSIS — O0990 Supervision of high risk pregnancy, unspecified, unspecified trimester: Secondary | ICD-10-CM

## 2020-06-23 DIAGNOSIS — O24319 Unspecified pre-existing diabetes mellitus in pregnancy, unspecified trimester: Secondary | ICD-10-CM

## 2020-06-23 DIAGNOSIS — O34219 Maternal care for unspecified type scar from previous cesarean delivery: Secondary | ICD-10-CM

## 2020-06-23 MED ORDER — METRONIDAZOLE 50 MG/ML ORAL SUSPENSION: 500.0000 mg | Freq: Two times a day (BID) | ORAL | 0 refills | Status: DC

## 2020-06-23 MED ORDER — METRONIDAZOLE 50 MG/ML ORAL SUSPENSION: 500.0000 mg | Freq: Two times a day (BID) | ORAL | 0 refills | Status: AC

## 2020-06-23 MED ORDER — FERROUS SULFATE 325 (65 FE) MG PO TABS: 325.0000 mg | ORAL_TABLET | Freq: Two times a day (BID) | ORAL | 1 refills | Status: DC

## 2020-06-23 NOTE — Progress Notes (Signed)
Rx printed in Office, reordered escribed

## 2020-06-23 NOTE — Progress Notes (Signed)
   PRENATAL VISIT NOTE  Subjective:  Lindsay Burnett is a 30 y.o. [phone] at [redacted]w[redacted]d being seen today for ongoing prenatal care.  She is currently monitored for the following issues for this high-risk pregnancy and has Type 2 diabetes mellitus (HCC); Supervision of high risk pregnancy, antepartum; History of severe pre-eclampsia; History of preterm delivery, currently pregnant in second trimester; Previous cesarean delivery affecting pregnancy, antepartum; Pre-existing diabetes mellitus affecting pregnancy, antepartum; and Chronic hypertension affecting pregnancy on their problem list.  Patient reports no complaints.  Contractions: Not present. Vag. Bleeding: None.  Movement: Present. Denies leaking of fluid.   The following portions of the patient's history were reviewed and updated as appropriate: allergies, current medications, past family history, past medical history, past social history, past surgical history and problem list.   Objective:   Vitals:   06/23/20 0916  BP: 130/83  Pulse: 90  Weight: 272 lb (123.4 kg)    Fetal Status: Fetal Heart Rate (bpm): 147 Fundal Height: 30 cm Movement: Present     General:  Alert, oriented and cooperative. Patient is in no acute distress.  Skin: Skin is warm and dry. No rash noted.   Cardiovascular: Normal heart rate noted  Respiratory: Normal respiratory effort, no problems with respiration noted  Abdomen: Soft, gravid, appropriate for gestational age.  Pain/Pressure: Absent     Pelvic: Cervical exam deferred        Extremities: Normal range of motion.  Edema: None  Mental Status: Normal mood and affect. Normal behavior. Normal judgment and thought content.   Assessment and Plan:  Pregnancy: [id number] at [redacted]w[redacted]d 1. Supervision of high risk pregnancy, antepartum Patient is doing well Rx ferrous sulfate provided Patient has yet to take liquid Flagyl as she states it was not available at her pharmacy  2. Chronic hypertension affecting  pregnancy Normotensive Continue ASA  3. Pre-existing diabetes mellitus affecting pregnancy, antepartum CBGs well controlled Scheduled to see endocrinologist next week Follow up growth ultrasound  4. Previous cesarean delivery affecting pregnancy, antepartum Will be scheduled for repeat Patient desires postplacental LIletta  Preterm labor symptoms and general obstetric precautions including but not limited to vaginal bleeding, contractions, leaking of fluid and fetal movement were reviewed in detail with the patient. Please refer to After Visit Summary for other counseling recommendations.   Return in about 2 weeks (around 07/07/2020) for in person, ROB, High risk.  Future Appointments  Date Time Provider Department Center  07/06/2020  8:45 AM Conan Bowens, MD [facility] None  07/13/2020 11:15 AM [facility] NURSE [facility]  07/13/2020 11:30 AM [facility] US3 [facility]  07/19/2020  9:00 AM [facility] NURSE [facility]  07/19/2020  9:15 AM [facility] US2 [facility]    Catalina Antigua, MD

## 2020-06-23 NOTE — Progress Notes (Signed)
ROB 29w Needs TOC for +TRICH on 05/02/20  Per pt was not able to take PO Rx for Trich states she has called the office regarding Rx coverage for liquid Rx has not been able to pick up Rx due to required PA.  *Nurse called pt pharmacy today , they confirmed that they do not have it and we should try a compound pharmacy.  CC: None

## 2020-06-27 ENCOUNTER — Telehealth: Payer: Self-pay | Admitting: *Deleted

## 2020-06-27 ENCOUNTER — Encounter: Payer: Self-pay | Admitting: *Deleted

## 2020-06-27 NOTE — Telephone Encounter (Signed)
Call to patient. Advised c-section scheduled for Friday, 09-01-20 at 0930, arrive 0730. Advised will receive letter in the mail and pre-op call from L&D pre-op nurse.   Encounter closed.

## 2020-07-06 ENCOUNTER — Encounter: Payer: Medicaid Other | Admitting: Obstetrics and Gynecology

## 2020-07-11 ENCOUNTER — Other Ambulatory Visit: Payer: Self-pay | Admitting: Obstetrics and Gynecology

## 2020-07-13 ENCOUNTER — Encounter: Payer: Self-pay | Admitting: *Deleted

## 2020-07-13 ENCOUNTER — Ambulatory Visit: Payer: Medicaid Other | Admitting: *Deleted

## 2020-07-13 ENCOUNTER — Ambulatory Visit: Payer: Medicaid Other | Attending: Obstetrics

## 2020-07-13 ENCOUNTER — Other Ambulatory Visit: Payer: Self-pay | Admitting: *Deleted

## 2020-07-13 ENCOUNTER — Other Ambulatory Visit: Payer: Self-pay | Admitting: Obstetrics

## 2020-07-13 ENCOUNTER — Other Ambulatory Visit: Payer: Self-pay

## 2020-07-13 VITALS — BP 147/74 | HR 95

## 2020-07-13 DIAGNOSIS — O0990 Supervision of high risk pregnancy, unspecified, unspecified trimester: Secondary | ICD-10-CM | POA: Insufficient documentation

## 2020-07-13 DIAGNOSIS — O24112 Pre-existing diabetes mellitus, type 2, in pregnancy, second trimester: Secondary | ICD-10-CM | POA: Diagnosis not present

## 2020-07-13 DIAGNOSIS — O09219 Supervision of pregnancy with history of pre-term labor, unspecified trimester: Secondary | ICD-10-CM

## 2020-07-13 DIAGNOSIS — O10919 Unspecified pre-existing hypertension complicating pregnancy, unspecified trimester: Secondary | ICD-10-CM

## 2020-07-13 DIAGNOSIS — O24319 Unspecified pre-existing diabetes mellitus in pregnancy, unspecified trimester: Secondary | ICD-10-CM | POA: Insufficient documentation

## 2020-07-13 DIAGNOSIS — O34219 Maternal care for unspecified type scar from previous cesarean delivery: Secondary | ICD-10-CM | POA: Diagnosis present

## 2020-07-13 DIAGNOSIS — O09892 Supervision of other high risk pregnancies, second trimester: Secondary | ICD-10-CM

## 2020-07-13 DIAGNOSIS — O24113 Pre-existing diabetes mellitus, type 2, in pregnancy, third trimester: Secondary | ICD-10-CM

## 2020-07-13 DIAGNOSIS — O09299 Supervision of pregnancy with other poor reproductive or obstetric history, unspecified trimester: Secondary | ICD-10-CM

## 2020-07-17 ENCOUNTER — Other Ambulatory Visit: Payer: Self-pay

## 2020-07-17 ENCOUNTER — Encounter: Payer: Self-pay | Admitting: Advanced Practice Midwife

## 2020-07-17 ENCOUNTER — Ambulatory Visit (INDEPENDENT_AMBULATORY_CARE_PROVIDER_SITE_OTHER): Payer: Self-pay | Admitting: Advanced Practice Midwife

## 2020-07-17 ENCOUNTER — Other Ambulatory Visit (HOSPITAL_COMMUNITY)
Admission: RE | Admit: 2020-07-17 | Discharge: 2020-07-17 | Disposition: A | Discharge status: Home / Self Care | Payer: Medicaid Other | Source: Ambulatory Visit | Attending: Obstetrics and Gynecology | Admitting: Obstetrics and Gynecology

## 2020-07-17 VITALS — BP 142/96 | HR 98 | Wt 268.0 lb

## 2020-07-17 DIAGNOSIS — A599 Trichomoniasis, unspecified: Secondary | ICD-10-CM

## 2020-07-17 DIAGNOSIS — E119 Type 2 diabetes mellitus without complications: Secondary | ICD-10-CM

## 2020-07-17 DIAGNOSIS — O99891 Other specified diseases and conditions complicating pregnancy: Secondary | ICD-10-CM

## 2020-07-17 DIAGNOSIS — O34219 Maternal care for unspecified type scar from previous cesarean delivery: Secondary | ICD-10-CM

## 2020-07-17 DIAGNOSIS — Z8759 Personal history of other complications of pregnancy, childbirth and the puerperium: Secondary | ICD-10-CM

## 2020-07-17 DIAGNOSIS — Z794 Long term (current) use of insulin: Secondary | ICD-10-CM

## 2020-07-17 DIAGNOSIS — O0990 Supervision of high risk pregnancy, unspecified, unspecified trimester: Secondary | ICD-10-CM

## 2020-07-17 DIAGNOSIS — O10919 Unspecified pre-existing hypertension complicating pregnancy, unspecified trimester: Secondary | ICD-10-CM

## 2020-07-17 DIAGNOSIS — M549 Dorsalgia, unspecified: Secondary | ICD-10-CM

## 2020-07-17 LAB — POCT URINALYSIS DIPSTICK
Bilirubin, UA: NEGATIVE
Glucose, UA: POSITIVE — AB
Ketones, UA: NEGATIVE
Leukocytes, UA: NEGATIVE
Nitrite, UA: NEGATIVE
Protein, UA: POSITIVE — AB
Spec Grav, UA: 1.025 (ref 1.010–1.025)
Urobilinogen, UA: 0.2 E.U./dL
pH, UA: 6.5 (ref 5.0–8.0)

## 2020-07-17 MED ORDER — LABETALOL HCL 200 MG PO TABS: 200.0000 mg | ORAL_TABLET | Freq: Two times a day (BID) | ORAL | 3 refills | Status: DC

## 2020-07-17 MED ORDER — NIFEDIPINE ER OSMOTIC RELEASE 30 MG PO TB24: 30.0000 mg | ORAL_TABLET | Freq: Every day | ORAL | 2 refills | Status: DC

## 2020-07-17 NOTE — Progress Notes (Signed)
ROB 32wks3d  CC: Pressure , Back pain.  Pt denies any HA's or visual changes.  B/P elevated today pt states B/P has been running high. Repeated B/P:142/96 P98

## 2020-07-17 NOTE — Progress Notes (Signed)
PRENATAL VISIT NOTE  Subjective:  Lindsay Burnett is a 30 y.o. [phone] at 20w3dbeing seen today for ongoing prenatal care.  She is currently monitored for the following issues for this high-risk pregnancy and has [place] 2 [place]; Supervision of high risk pregnancy, antepartum; History of severe pre-eclampsia; History of preterm delivery, currently pregnant in second trimester; Previous cesarean delivery affecting pregnancy, antepartum; Pre-existing diabetes mellitus affecting pregnancy, antepartum; and Chronic hypertension affecting pregnancy on their problem list.  Patient reports  pelvic pressure and back pain .  Contractions: Irritability. Vag. Bleeding: None.  Movement: Present. Denies leaking of fluid.   The following portions of the patient's history were reviewed and updated as appropriate: allergies, current medications, past family history, past medical history, past social history, past surgical history and problem list.   Objective:   Vitals:   07/17/20 1501 07/17/20 1615  BP: (!) 158/98 (!) 142/96  Pulse: 100 98  Weight: 268 lb (121.6 kg)     Fetal Status: Fetal Heart Rate (bpm): 150 Fundal Height: 32 cm Movement: Present     General:  Alert, oriented and cooperative. Patient is in no acute distress.  Skin: Skin is warm and dry. No rash noted.   Cardiovascular: Normal heart rate noted  Respiratory: Normal respiratory effort, no problems with respiration noted  Abdomen: Soft, gravid, appropriate for gestational age.  Pain/Pressure: Present     Pelvic: Cervical exam performed in the presence of a chaperone Dilation: 1 Effacement (%): 0 Station: Ballotable  Extremities: Normal range of motion.  Edema: Trace  Mental Status: Normal mood and affect. Normal behavior. Normal judgment and thought content.   Assessment and Plan:  Pregnancy: [id number] 331w3d. Supervision of high risk pregnancy, antepartum --Anticipatory guidance about next visits/weeks of  pregnancy given. --Next visit in 1 week with MD to evaluate BP  2. Chronic hypertension affecting pregnancy --No meds in pregnancy until today. BP elevated, no severe range. No s/sx of PEC.  --Pt started on Nifedipine 30 XL, as she had side effects from labetalol in her last pregnancy and wanted to avoid it.   --BP check at her [place] 2 days with MFM --PEC labs today and precautions/reasons to go to MAU reviewed  - CBC - Comp Met (CMET) - Protein / creatinine ratio, urine  3. Previous cesarean delivery affecting pregnancy, antepartum --Repeat C/S planned. Pt had vaginal delivery with first baby and would love that option this pregnancy. She is aware that her elevated BP will likely require induction, which is not recommended with 2 prior cesareans. She is Ok with scheduling repeat, but if she goes into labor, she would like the option to VBR[place]  4. History of severe pre-eclampsia --x 2 with preterm delivery by cesarean x 2 --On BASA -No s/sx of PEC today but HTN for first time this pregnancy  5. Type 2 diabetes mellitus without complication, with long-term current use of insulin (HCC) --Log not reviewed today but glucose values well controlled on insulin per pt --Appt in 1 week with MD  6. Trichimoniasis --Tx 05/04/20, needs TOC - Cervicovaginal ancillary only( [place])  7. Back pain affecting pregnancy in third trimester --significant pelvic and back pain.  --Cervix 1 but long and posterior, no s/sx of PTL --Signs of labor/reasons to go to MAU reviewed - POCT Urinalysis Dipstick   Preterm labor symptoms and general obstetric precautions including but not limited to vaginal bleeding, contractions, leaking of fluid and fetal movement were reviewed in  detail with the patient. Please refer to After Visit Summary for other counseling recommendations.   Return in about 1 week (around 07/24/2020).  Future Appointments  Date Time Provider [place]  07/19/2020  9:00 AM  [facility] NURSE [facility]  07/19/2020  9:15 AM [facility] US2 [facility]  07/24/2020  1:20 PM Leftwich-Kirby, Braelyn Jenson A, CNM [facility] None  07/28/2020 11:00 AM [facility] NURSE [facility]  07/28/2020 11:15 AM [facility] US2 [facility]  08/04/2020  8:30 AM [facility] NURSE [facility]  08/04/2020  8:45 AM [facility] US6 [facility]  08/11/2020  8:45 AM [facility] NURSE [facility]  08/11/2020  9:00 AM [facility] US1 [facility]    Fatima Blank, CNM

## 2020-07-17 NOTE — Patient Instructions (Signed)
Hypertension During Pregnancy High blood pressure (hypertension) is when the force of blood pumping through the arteries is high enough to cause problems with your health. Arteries are blood vessels that carry blood from the heart throughout the body. Hypertension during pregnancy can cause problems for you and your baby. It can be mild or severe. There are different types of hypertension that can happen during pregnancy. These include: Chronic hypertension. This happens when you had high blood pressure before you became pregnant, and it continues during the pregnancy. Hypertension that develops before you are [redacted] weeks pregnant and continues during the pregnancy is also called chronic hypertension. If you have chronic hypertension, it will not go away after you have your baby. You will need follow-up visits with your health care provider after you have your baby. Your health care provider may want you to keep taking medicine for your blood pressure. Gestational hypertension. This is hypertension that develops after the 20th week of pregnancy. Gestational hypertension usually goes away after you have your baby, but your health care provider will need to monitor your blood pressure to make sure that it is getting better. Postpartum hypertension. This is high blood pressure that was present before delivery and continues after delivery or that starts after delivery. This usually occurs within 48 hours after childbirth but may occur up to 6 weeks after giving birth. When hypertension during pregnancy is severe, it is a medical emergency that requires treatment right away. How does this affect me? Women who have hypertension during pregnancy have a greater chance of developing hypertension later in life or during future pregnancies. In some cases, hypertension during pregnancy can cause serious complications, such as: Stroke. Heart attack. Injury to other organs, such as kidneys, lungs, or  liver. Preeclampsia. A condition called hemolysis, elevated liver enzymes, and low platelet count (HELLP) syndrome. Convulsions or seizures. Placental abruption. How does this affect my baby? Hypertension during pregnancy can affect your baby. Your baby may: Be born early (prematurely). Not weigh as much as he or she should at birth (low birth weight). Not tolerate labor well, leading to an unplanned cesarean delivery. This condition may also result in a baby's death before birth (stillbirth). What are the risks? There are certain factors that make it more likely for you to develop hypertension during pregnancy. These include: Having hypertension during a previous pregnancy or a family history of hypertension. Being overweight. Being age 35 or older. Being pregnant for the first time. Being pregnant with more than one baby. Becoming pregnant using fertilization methods, such as IVF (in vitro fertilization). Having other medical problems, such as diabetes, kidney disease, or lupus. What can I do to lower my risk? The exact cause of hypertension during pregnancy is not known. You may be able to lower your risk by: Maintaining a healthy weight. Eating a healthy and balanced diet. Following your health care provider's instructions about treating any long-term conditions that you had before becoming pregnant. It is very important to keep all of your prenatal care appointments. Your health care provider will check your blood pressure and make sure that your pregnancy is progressing as expected. If a problem is found, early treatment can prevent complications. How is this treated? Treatment for hypertension during pregnancy varies depending on the type of hypertension you have and how serious it is. If you were taking medicine for high blood pressure before you became pregnant, talk with your health care provider. You may need to change medicine during pregnancy because some   medicines, like ACE  inhibitors, may not be considered safe for your baby. If you have gestational hypertension, your health care provider may order medicine to treat this during pregnancy. If you are at risk for preeclampsia, your health care provider may recommend that you take a low-dose aspirin during your pregnancy. If you have severe hypertension, you may need to be hospitalized so you and your baby can be monitored closely. You may also need to be given medicine to lower your blood pressure. In some cases, if your condition gets worse, you may need to deliver your baby early. Follow these instructions at home: Eating and drinking  Drink enough fluid to keep your urine pale yellow. Avoid caffeine. Lifestyle Do not use any products that contain nicotine or tobacco. These products include cigarettes, chewing tobacco, and vaping devices, such as e-cigarettes. If you need help quitting, ask your health care provider. Do not use alcohol or drugs. Avoid stress as much as possible. Rest and get plenty of sleep. Regular exercise can help to reduce your blood pressure. Ask your health care provider what kinds of exercise are best for you. General instructions Take over-the-counter and prescription medicines only as told by your health care provider. Keep all prenatal and follow-up visits. This is important. Contact a health care provider if: You have symptoms that your health care provider told you may require more treatment or monitoring, such as: Headaches. Nausea or vomiting. Abdominal pain. Dizziness. Light-headedness. Get help right away if: You have symptoms of serious complications, such as: Severe abdominal pain that does not get better with treatment. A severe headache that does not get better, blurred vision, or double vision. Vomiting that does not get better. Sudden, rapid weight gain or swelling in your hands, ankles, or face. Vaginal bleeding. Blood in your urine. Shortness of breath or chest  pain. Weakness on one side of your body or difficulty speaking. Your baby is not moving as much as usual. These symptoms may represent a serious problem that is an emergency. Do not wait to see if the symptoms will go away. Get medical help right away. Call your local emergency services (911 in the [place] Do not drive yourself to the hospital. Summary Hypertension during pregnancy can cause problems for you and your baby. Treatment for hypertension during pregnancy varies depending on the type of hypertension you have and how serious it is. Keep all prenatal and follow-up visits. This is important. Get help right away if you have symptoms of serious complications related to high blood pressure. This information is not intended to replace advice given to you by your health care provider. Make sure you discuss any questions you have with your health care provider. Document Revised: 09/16/2019 Document Reviewed: 09/16/2019 Elsevier Patient Education  2022 [employer].  

## 2020-07-19 ENCOUNTER — Ambulatory Visit: Payer: Medicaid Other

## 2020-07-19 ENCOUNTER — Ambulatory Visit: Payer: Medicaid Other | Attending: Obstetrics

## 2020-07-19 LAB — COMPREHENSIVE METABOLIC PANEL
ALT: 8 IU/L (ref 0–32)
AST: 6 IU/L (ref 0–40)
Albumin/Globulin Ratio: 1.5 (ref 1.2–2.2)
Albumin: 3.4 g/dL — ABNORMAL LOW (ref 3.8–4.8)
Alkaline Phosphatase: 120 IU/L (ref 44–121)
BUN/Creatinine Ratio: 8 — ABNORMAL LOW (ref 9–23)
BUN: 8 mg/dL (ref 6–20)
Bilirubin Total: <0.2 mg/dL (ref 0.0–1.2)
CO2: 21 mmol/L (ref 20–29)
Calcium: 9.4 mg/dL (ref 8.7–10.2)
Chloride: 104 mmol/L (ref 96–106)
Creatinine, Ser: 1.04 mg/dL — ABNORMAL HIGH (ref 0.57–1.00)
Globulin, Total: 2.3 g/dL (ref 1.5–4.5)
Glucose: 110 mg/dL — ABNORMAL HIGH (ref 65–99)
Potassium: 4.6 mmol/L (ref 3.5–5.2)
Sodium: 139 mmol/L (ref 134–144)
Total Protein: 5.7 g/dL — ABNORMAL LOW (ref 6.0–8.5)
eGFR: 73 mL/min/{1.73_m2} (ref >59)

## 2020-07-19 LAB — CBC
Hematocrit: 33.2 % — ABNORMAL LOW (ref 34.0–46.6)
Hemoglobin: 11.5 g/dL (ref 11.1–15.9)
MCH: 29.1 pg (ref 26.6–33.0)
MCHC: 34.6 g/dL (ref 31.5–35.7)
MCV: 84 fL (ref 79–97)
Platelets: 229 10*3/uL (ref 150–450)
RBC: 3.95 x10E6/uL (ref 3.77–5.28)
RDW: 13.7 % (ref 11.7–15.4)
WBC: 8.7 10*3/uL (ref 3.4–10.8)

## 2020-07-19 LAB — PROTEIN / CREATININE RATIO, URINE
Creatinine, Urine: 243.3 mg/dL
Protein, Ur: 2485.9 mg/dL
Protein/Creat Ratio: 10217 mg/g creat — ABNORMAL HIGH (ref 0–200)

## 2020-07-19 LAB — CERVICOVAGINAL ANCILLARY ONLY
Chlamydia: NEGATIVE
Comment: NEGATIVE
Comment: NEGATIVE
Comment: NORMAL
Neisseria Gonorrhea: NEGATIVE
Trichomonas: NEGATIVE

## 2020-07-24 ENCOUNTER — Ambulatory Visit (INDEPENDENT_AMBULATORY_CARE_PROVIDER_SITE_OTHER): Payer: Medicaid Other | Admitting: Advanced Practice Midwife

## 2020-07-24 ENCOUNTER — Other Ambulatory Visit: Payer: Self-pay

## 2020-07-24 VITALS — BP 136/89 | HR 105 | Wt 267.0 lb

## 2020-07-24 DIAGNOSIS — E119 Type 2 diabetes mellitus without complications: Secondary | ICD-10-CM

## 2020-07-24 DIAGNOSIS — O0990 Supervision of high risk pregnancy, unspecified, unspecified trimester: Secondary | ICD-10-CM

## 2020-07-24 DIAGNOSIS — Z794 Long term (current) use of insulin: Secondary | ICD-10-CM

## 2020-07-24 DIAGNOSIS — M549 Dorsalgia, unspecified: Secondary | ICD-10-CM

## 2020-07-24 DIAGNOSIS — O34219 Maternal care for unspecified type scar from previous cesarean delivery: Secondary | ICD-10-CM

## 2020-07-24 DIAGNOSIS — O10919 Unspecified pre-existing hypertension complicating pregnancy, unspecified trimester: Secondary | ICD-10-CM

## 2020-07-24 DIAGNOSIS — Z8759 Personal history of other complications of pregnancy, childbirth and the puerperium: Secondary | ICD-10-CM

## 2020-07-24 DIAGNOSIS — O99891 Other specified diseases and conditions complicating pregnancy: Secondary | ICD-10-CM

## 2020-07-24 DIAGNOSIS — A5901 Trichomonal vulvovaginitis: Secondary | ICD-10-CM | POA: Insufficient documentation

## 2020-07-24 DIAGNOSIS — O23592 Infection of other part of genital tract in pregnancy, second trimester: Secondary | ICD-10-CM

## 2020-07-24 MED ORDER — CYCLOBENZAPRINE HCL 10 MG PO TABS
5.0000 mg | ORAL_TABLET | Freq: Three times a day (TID) | ORAL | 0 refills | Status: DC | PRN
Start: 2020-07-24 — End: 2020-08-15

## 2020-07-24 NOTE — Progress Notes (Addendum)
PRENATAL VISIT NOTE  Subjective:  Lindsay Burnett is a 30 y.o. [phone] at [redacted]w[redacted]d being seen today for ongoing prenatal care.  She is currently monitored for the following issues for this low-risk pregnancy and has Type 2 diabetes mellitus (HCC); Supervision of high risk pregnancy, antepartum; History of severe pre-eclampsia; History of preterm delivery, currently pregnant in second trimester; Previous cesarean delivery affecting pregnancy, antepartum; Pre-existing diabetes mellitus affecting pregnancy, antepartum; Chronic hypertension affecting pregnancy; and Trichomonal vaginitis during pregnancy in second trimester on their problem list.  Patient reports  back pain .  Contractions: Irritability. Vag. Bleeding: None.  Movement: Present. Denies leaking of fluid.   The following portions of the patient's history were reviewed and updated as appropriate: allergies, current medications, past family history, past medical history, past social history, past surgical history and problem list.   Objective:   Vitals:   07/24/20 1326  BP: 136/89  Pulse: (!) 105  Weight: 267 lb (121.1 kg)    Fetal Status: Fetal Heart Rate (bpm): 154   Movement: Present     General:  Alert, oriented and cooperative. Patient is in no acute distress.  Skin: Skin is warm and dry. No rash noted.   Cardiovascular: Normal heart rate noted  Respiratory: Normal respiratory effort, no problems with respiration noted  Abdomen: Soft, gravid, appropriate for gestational age.  Pain/Pressure: Present     Pelvic: Cervical exam deferred        Extremities: Normal range of motion.  Edema: Trace  Mental Status: Normal mood and affect. Normal behavior. Normal judgment and thought content.   Assessment and Plan:  Pregnancy: [id number] at [redacted]w[redacted]d 1. Supervision of high risk pregnancy, antepartum --Anticipatory guidance about next visits/weeks of pregnancy given. --Next visit in 2 weeks for GBS, scheduled C/S at 39 weeks, may  need to  do sooner depending on glucose control  2. Chronic hypertension affecting pregnancy --On Procardia 30 XL daily --BP wnl today, no s/sx of PEC --Labs on 07/17/20 with elevated creatinine, similar to new OB, trending up from last lab.  P/C ratio above 10,000.  --Consult Dr Macon Large. Pt to have labs this week before her MFM visit Friday, add MFM consult to her ultrasound.  CBC, CMP ordered.  3. Previous cesarean delivery affecting pregnancy, antepartum -Previous C/S x 2, previous VD x 1. Repeat C/S scheduled but would deliver vaginally if labor onset  4. History of severe pre-eclampsia --Taking BASA  5. Type 2 diabetes mellitus without complication, with long-term current use of insulin (HCC) --Pt sees [facility] for [facility]ocrinology.  Last visit 05/24/20, 87% of glucose values were within range at that time. --Pt uses Dexcom and glucose values uploaded electronically, consult diabetic education on how to access these --Pt missed appt last week with endocrinology, will call to reschedule as soon as possible. --Keep scheduled anenatal testing with MFM, next [place] on 07/28/20   6. Trichomonal vaginitis during pregnancy in second trimester --TOC negative  7. Back pain affecting pregnancy in third trimester --Pt has not gotten pregnancy support belt yet, plans to get one this weekend.  --Rx for Flexeril 10 mg TID PRN   Preterm labor symptoms and general obstetric precautions including but not limited to vaginal bleeding, contractions, leaking of fluid and fetal movement were reviewed in detail with the patient. Please refer to After Visit Summary for other counseling recommendations.   Return in about 2 weeks (around 08/07/2020).  Future Appointments  Date Time Provider Department Center  07/28/2020 11:00 AM [facility] NURSE [facility]  [facility]  07/28/2020 11:15 AM [facility] US2 [facility]  08/04/2020  8:30 AM [facility] NURSE [facility]  08/04/2020  8:45 AM [facility] US6 [facility]  08/07/2020 11:15 AM Warner Mccreedy, MD [facility] None  08/11/2020  8:45 AM [facility] NURSE [facility]  08/11/2020  9:00 AM [facility] US1 [facility]    Sharen Counter, CNM

## 2020-07-24 NOTE — Addendum Note (Signed)
Addended by: Sharen Counter A on: 07/24/2020 05:28 PM   Modules accepted: Orders

## 2020-07-24 NOTE — Addendum Note (Signed)
Addended by: Sharen Counter A on: 07/24/2020 05:47 PM   Modules accepted: Orders

## 2020-07-26 ENCOUNTER — Other Ambulatory Visit: Payer: Medicaid Other

## 2020-07-26 ENCOUNTER — Other Ambulatory Visit: Payer: Self-pay

## 2020-07-26 DIAGNOSIS — O10919 Unspecified pre-existing hypertension complicating pregnancy, unspecified trimester: Secondary | ICD-10-CM

## 2020-07-27 LAB — COMPREHENSIVE METABOLIC PANEL
ALT: 10 IU/L (ref 0–32)
AST: 8 IU/L (ref 0–40)
Albumin/Globulin Ratio: 1.4 (ref 1.2–2.2)
Albumin: 3.4 g/dL — ABNORMAL LOW (ref 3.8–4.8)
Alkaline Phosphatase: 135 IU/L — ABNORMAL HIGH (ref 44–121)
BUN/Creatinine Ratio: 14 (ref 9–23)
BUN: 13 mg/dL (ref 6–20)
Bilirubin Total: <0.2 mg/dL (ref 0.0–1.2)
CO2: 19 mmol/L — ABNORMAL LOW (ref 20–29)
Calcium: 9 mg/dL (ref 8.7–10.2)
Chloride: 105 mmol/L (ref 96–106)
Creatinine, Ser: 0.96 mg/dL (ref 0.57–1.00)
Globulin, Total: 2.4 g/dL (ref 1.5–4.5)
Glucose: 89 mg/dL (ref 65–99)
Potassium: 4.6 mmol/L (ref 3.5–5.2)
Sodium: 137 mmol/L (ref 134–144)
Total Protein: 5.8 g/dL — ABNORMAL LOW (ref 6.0–8.5)
eGFR: 81 mL/min/{1.73_m2} (ref >59)

## 2020-07-27 LAB — CBC
Hematocrit: 32.1 % — ABNORMAL LOW (ref 34.0–46.6)
Hemoglobin: 11 g/dL — ABNORMAL LOW (ref 11.1–15.9)
MCH: 29 pg (ref 26.6–33.0)
MCHC: 34.3 g/dL (ref 31.5–35.7)
MCV: 85 fL (ref 79–97)
Platelets: 219 10*3/uL (ref 150–450)
RBC: 3.79 x10E6/uL (ref 3.77–5.28)
RDW: 13.6 % (ref 11.7–15.4)
WBC: 7.5 10*3/uL (ref 3.4–10.8)

## 2020-07-28 ENCOUNTER — Ambulatory Visit: Payer: Medicaid Other | Admitting: *Deleted

## 2020-07-28 ENCOUNTER — Other Ambulatory Visit: Payer: Self-pay | Admitting: *Deleted

## 2020-07-28 ENCOUNTER — Ambulatory Visit (HOSPITAL_BASED_OUTPATIENT_CLINIC_OR_DEPARTMENT_OTHER): Payer: Medicaid Other | Admitting: Obstetrics

## 2020-07-28 ENCOUNTER — Ambulatory Visit: Payer: Medicaid Other | Attending: Obstetrics and Gynecology

## 2020-07-28 ENCOUNTER — Other Ambulatory Visit: Payer: Self-pay

## 2020-07-28 ENCOUNTER — Encounter: Payer: Self-pay | Admitting: *Deleted

## 2020-07-28 VITALS — BP 130/84 | HR 102

## 2020-07-28 DIAGNOSIS — O1210 Gestational proteinuria, unspecified trimester: Secondary | ICD-10-CM | POA: Diagnosis present

## 2020-07-28 DIAGNOSIS — O24113 Pre-existing diabetes mellitus, type 2, in pregnancy, third trimester: Secondary | ICD-10-CM

## 2020-07-28 DIAGNOSIS — O10913 Unspecified pre-existing hypertension complicating pregnancy, third trimester: Secondary | ICD-10-CM | POA: Insufficient documentation

## 2020-07-28 DIAGNOSIS — O10919 Unspecified pre-existing hypertension complicating pregnancy, unspecified trimester: Secondary | ICD-10-CM | POA: Insufficient documentation

## 2020-07-28 DIAGNOSIS — Z3A34 34 weeks gestation of pregnancy: Secondary | ICD-10-CM | POA: Insufficient documentation

## 2020-07-28 DIAGNOSIS — O09892 Supervision of other high risk pregnancies, second trimester: Secondary | ICD-10-CM | POA: Diagnosis present

## 2020-07-28 DIAGNOSIS — O24319 Unspecified pre-existing diabetes mellitus in pregnancy, unspecified trimester: Secondary | ICD-10-CM | POA: Diagnosis present

## 2020-07-28 DIAGNOSIS — O0990 Supervision of high risk pregnancy, unspecified, unspecified trimester: Secondary | ICD-10-CM | POA: Insufficient documentation

## 2020-07-28 DIAGNOSIS — O34219 Maternal care for unspecified type scar from previous cesarean delivery: Secondary | ICD-10-CM

## 2020-07-28 NOTE — Progress Notes (Signed)
MFM Consult Note  Lindsay Burnett was seen for a biophysical profile due to pregestational diabetes treated with insulin and chronic hypertension treated with Procardia.  The patient had a P/C ratio performed earlier this week that was 10,217.  Her PIH labs were all within normal limits.  Her serum creatinine level was slightly elevated (0.96).  However, this is improved from the serum creatinine level noted earlier in her pregnancy.  The patient reports that she was diagnosed with diabetes about 16 years ago.  She had been treated with lisinopril for blood pressure control outside of pregnancy.  She was recently started on Procardia for blood pressure control.  Her pregnancy history is significant for two indicated preterm deliveries at 65 and 34 weeks due to severe preeclampsia.  She has two prior cesarean deliveries.  She denies any signs or symptoms of preeclampsia today.  Her blood pressure today was 130/84.  A biophysical profile performed today was 8 out of 8.    There was normal amniotic fluid noted on today's ultrasound exam.  The following were discussed during today's consultation:  Elevated P/C ratio in the setting of chronic hypertension and pre-gestational diabetes  The patient was advised regarding the concern that she may be developing preeclampsia due to her significantly increased P/C ratio.  I reviewed the patient's chart in Epic today.  It does not appear that she had a prior P/C ratio performed or a baseline 24-hour urine collected.  Based on her elevated serum creatinine levels noted earlier in her pregnancy and her long history of pregestational diabetes, I suspect that the patient probably has underlying kidney disease and probably had significant proteinuria prior to pregnancy.  She was advised that as she did not have any evaluation of proteinuria earlier in her pregnancy, it remains undetermined if the significant proteinuria is due to preeclampsia or underlying  kidney disease.  As her PIH labs are all within normal limits, as she is asymptomatic, and as her blood pressures are well controlled, we will continue close observation for now.    The patient was offered to come in for twice-weekly fetal testing.  She declined as she lives too far away.  She will continue weekly fetal testing until delivery.    I have scheduled a growth ultrasound for her in addition to the biophysical profile in 1 week.  We will repeat her PIH labs again in 1 week.  Due to concerns regarding preeclampsia and due to her underlying medical conditions, delivery is recommended at around 36 weeks (in 2 weeks).  Due to pregestational diabetes and as she will be delivering in the late preterm period, a course of antenatal corticosteroids will not be necessary prior to delivery.    The patient understands that there may be a small risk that her baby may require a NICU admission for delivery at 36 weeks.  She is willing to accept the small risk.    She also understands that she will most likely require a repeat cesarean delivery.    Preeclampsia precautions were reviewed today.  She understands that she should go to the hospital should she experience any signs or symptoms of severe preeclampsia.  A total of 25 minutes was spent counseling and coordinating the care for this patient.  Greater than 50% of the time was spent in direct face-to-face contact.  Recommendations:   Repeat biophysical profile and growth scan in 1 week Repeat PIH labs in 1 week Delivery at 36 weeks (antenatal corticosteroids are not indicated  prior to delivery) Delivery prior to 36 weeks should she complain of any signs or symptoms of severe preeclampsia

## 2020-07-31 ENCOUNTER — Other Ambulatory Visit: Payer: Self-pay

## 2020-07-31 ENCOUNTER — Encounter: Payer: Self-pay | Admitting: Obstetrics and Gynecology

## 2020-07-31 ENCOUNTER — Ambulatory Visit (INDEPENDENT_AMBULATORY_CARE_PROVIDER_SITE_OTHER): Payer: Medicaid Other | Admitting: Obstetrics and Gynecology

## 2020-07-31 ENCOUNTER — Encounter: Payer: Self-pay | Admitting: Obstetrics & Gynecology

## 2020-07-31 VITALS — BP 135/83 | HR 94 | Wt 261.0 lb

## 2020-07-31 DIAGNOSIS — O24319 Unspecified pre-existing diabetes mellitus in pregnancy, unspecified trimester: Secondary | ICD-10-CM

## 2020-07-31 DIAGNOSIS — O34219 Maternal care for unspecified type scar from previous cesarean delivery: Secondary | ICD-10-CM

## 2020-07-31 DIAGNOSIS — O10919 Unspecified pre-existing hypertension complicating pregnancy, unspecified trimester: Secondary | ICD-10-CM

## 2020-07-31 DIAGNOSIS — O0990 Supervision of high risk pregnancy, unspecified, unspecified trimester: Secondary | ICD-10-CM

## 2020-07-31 DIAGNOSIS — Z8759 Personal history of other complications of pregnancy, childbirth and the puerperium: Secondary | ICD-10-CM

## 2020-07-31 NOTE — Progress Notes (Signed)
   PRENATAL VISIT NOTE  Subjective:  Lindsay Burnett is a 30 y.o. [phone] at [redacted]w[redacted]d being seen today for ongoing prenatal care.  She is currently monitored for the following issues for this high-risk pregnancy and has Type 2 diabetes mellitus (HCC); Supervision of high risk pregnancy, antepartum; History of severe pre-eclampsia; History of preterm delivery, currently pregnant in second trimester; Previous cesarean delivery affecting pregnancy, antepartum; Pre-existing diabetes mellitus affecting pregnancy, antepartum; Chronic hypertension affecting pregnancy; and Trichomonal vaginitis during pregnancy in second trimester on their problem list.  Patient reports no complaints.  Contractions: Irritability. Vag. Bleeding: None.  Movement: Present. Denies leaking of fluid.   The following portions of the patient's history were reviewed and updated as appropriate: allergies, current medications, past family history, past medical history, past social history, past surgical history and problem list.   Objective:   Vitals:   07/31/20 1034  BP: 135/83  Pulse: 94  Weight: 261 lb (118.4 kg)    Fetal Status:     Movement: Present     General:  Alert, oriented and cooperative. Patient is in no acute distress.  Skin: Skin is warm and dry. No rash noted.   Cardiovascular: Normal heart rate noted  Respiratory: Normal respiratory effort, no problems with respiration noted  Abdomen: Soft, gravid, appropriate for gestational age.  Pain/Pressure: Present     Pelvic: Cervical exam deferred        Extremities: Normal range of motion.  Edema: Trace  Mental Status: Normal mood and affect. Normal behavior. Normal judgment and thought content.   Assessment and Plan:  Pregnancy: [id number] at [redacted]w[redacted]d 1. Supervision of high risk pregnancy, antepartum Patient is doing well without complaints  2. Chronic hypertension affecting pregnancy Stable on Procardia 30 XL Follow up ultrasound scheduled Per MFM, repeat labs  ordered and repeat c-section scheduled at 36 weeks on 8/5  3. Pre-existing diabetes mellitus affecting pregnancy, antepartum Patient has not had her Dexcom reader since her last visit but reports CBGs were well controlled at her last visit Patient plans to pick up toda  4. History of severe pre-eclampsia Asymptomatic  Reviewed symptoms  5. Previous cesarean delivery affecting pregnancy, antepartum Will be scheduled for repeat c-section at 36 weeks on 8/5  Preterm labor symptoms and general obstetric precautions including but not limited to vaginal bleeding, contractions, leaking of fluid and fetal movement were reviewed in detail with the patient. Please refer to After Visit Summary for other counseling recommendations.   Return in about 1 week (around 08/07/2020) for in person, ROB, High risk.  Future Appointments  Date Time Provider Department Center  07/31/2020 10:45 AM Nollie Terlizzi, Gigi Gin, MD [facility] None  08/04/2020  8:30 AM [facility] NURSE [facility]  08/04/2020  8:45 AM [facility] US6 [facility]  08/07/2020  1:30 PM Chiquita Heckert, MD [facility] None  08/11/2020  8:45 AM [facility] NURSE [facility]  08/11/2020  9:00 AM [facility] US1 [facility]    Catalina Antigua, MD

## 2020-07-31 NOTE — Progress Notes (Signed)
+   Fetal movement. No complaints. Pt has to pick up Dexcom when she leaves, has not had since Saturday. States glucose levels had been running well before that.

## 2020-08-01 ENCOUNTER — Encounter (HOSPITAL_COMMUNITY): Payer: Self-pay

## 2020-08-01 LAB — CBC
Hematocrit: 33.6 % — ABNORMAL LOW (ref 34.0–46.6)
Hemoglobin: 11.8 g/dL (ref 11.1–15.9)
MCH: 29.5 pg (ref 26.6–33.0)
MCHC: 35.1 g/dL (ref 31.5–35.7)
MCV: 84 fL (ref 79–97)
Platelets: 224 10*3/uL (ref 150–450)
RBC: 4 x10E6/uL (ref 3.77–5.28)
RDW: 13.2 % (ref 11.7–15.4)
WBC: 8 10*3/uL (ref 3.4–10.8)

## 2020-08-01 LAB — COMPREHENSIVE METABOLIC PANEL
ALT: 9 IU/L (ref 0–32)
AST: 9 IU/L (ref 0–40)
Albumin/Globulin Ratio: 1.3 (ref 1.2–2.2)
Albumin: 3.3 g/dL — ABNORMAL LOW (ref 3.8–4.8)
Alkaline Phosphatase: 153 IU/L — ABNORMAL HIGH (ref 44–121)
BUN/Creatinine Ratio: 12 (ref 9–23)
BUN: 13 mg/dL (ref 6–20)
Bilirubin Total: 0.2 mg/dL (ref 0.0–1.2)
CO2: 19 mmol/L — ABNORMAL LOW (ref 20–29)
Calcium: 9.1 mg/dL (ref 8.7–10.2)
Chloride: 103 mmol/L (ref 96–106)
Creatinine, Ser: 1.06 mg/dL — ABNORMAL HIGH (ref 0.57–1.00)
Globulin, Total: 2.5 g/dL (ref 1.5–4.5)
Glucose: 100 mg/dL — ABNORMAL HIGH (ref 65–99)
Potassium: 4.6 mmol/L (ref 3.5–5.2)
Sodium: 134 mmol/L (ref 134–144)
Total Protein: 5.8 g/dL — ABNORMAL LOW (ref 6.0–8.5)
eGFR: 72 mL/min/{1.73_m2} (ref >59)

## 2020-08-01 LAB — PROTEIN / CREATININE RATIO, URINE
Creatinine, Urine: 162.4 mg/dL
Protein, Ur: 1729.1 mg/dL
Protein/Creat Ratio: 10647 mg/g creat — ABNORMAL HIGH (ref 0–200)

## 2020-08-01 NOTE — Patient Instructions (Addendum)
Annalyce Lanpher  08/01/2020   Your procedure is scheduled on:  08/12/2020  Arrive at 0930 at Entrance C on [address] at [facility]  and [employer]. You are invited to use the FREE valet parking or use the Visitor's parking deck.  Pick up the phone at the desk and dial [phone].  Call this number if you have problems the morning of surgery: [phone]  Remember:   Do not eat food:(After Midnight) Desps de medianoche.  Do not drink clear liquids: (After Midnight) Desps de medianoche.  Take these medicines the morning of surgery with A SIP OF WATER:  Take nifedipine as prescribed.  Take half (10) units of Lantus insulin the night before surgery.   Do not wear jewelry, make-up or nail polish.  Do not wear lotions, powders, or perfumes. Do not wear deodorant.  Do not shave 48 hours prior to surgery.  Do not bring valuables to the hospital.  [facility] is not   responsible for any belongings or valuables brought to the hospital.  Contacts, dentures or bridgework may not be worn into surgery.  Leave suitcase in the car. After surgery it may be brought to your room.  For patients admitted to the hospital, checkout time is 11:00 AM the day of              discharge.      Please read over the following fact sheets that you were given:     Preparing for Surgery

## 2020-08-04 ENCOUNTER — Ambulatory Visit: Payer: Medicaid Other | Admitting: *Deleted

## 2020-08-04 ENCOUNTER — Ambulatory Visit: Payer: Medicaid Other | Attending: Obstetrics and Gynecology

## 2020-08-04 ENCOUNTER — Other Ambulatory Visit: Payer: Self-pay

## 2020-08-04 VITALS — BP 132/85 | HR 98

## 2020-08-04 DIAGNOSIS — O119 Pre-existing hypertension with pre-eclampsia, unspecified trimester: Secondary | ICD-10-CM

## 2020-08-04 DIAGNOSIS — O24319 Unspecified pre-existing diabetes mellitus in pregnancy, unspecified trimester: Secondary | ICD-10-CM | POA: Insufficient documentation

## 2020-08-04 DIAGNOSIS — O0990 Supervision of high risk pregnancy, unspecified, unspecified trimester: Secondary | ICD-10-CM | POA: Diagnosis present

## 2020-08-04 DIAGNOSIS — O09892 Supervision of other high risk pregnancies, second trimester: Secondary | ICD-10-CM

## 2020-08-04 DIAGNOSIS — O34219 Maternal care for unspecified type scar from previous cesarean delivery: Secondary | ICD-10-CM | POA: Insufficient documentation

## 2020-08-04 DIAGNOSIS — O24113 Pre-existing diabetes mellitus, type 2, in pregnancy, third trimester: Secondary | ICD-10-CM | POA: Diagnosis not present

## 2020-08-04 DIAGNOSIS — E119 Type 2 diabetes mellitus without complications: Secondary | ICD-10-CM

## 2020-08-04 DIAGNOSIS — O10013 Pre-existing essential hypertension complicating pregnancy, third trimester: Secondary | ICD-10-CM

## 2020-08-04 DIAGNOSIS — Z3A35 35 weeks gestation of pregnancy: Secondary | ICD-10-CM

## 2020-08-04 DIAGNOSIS — O10913 Unspecified pre-existing hypertension complicating pregnancy, third trimester: Secondary | ICD-10-CM | POA: Diagnosis not present

## 2020-08-04 DIAGNOSIS — Z794 Long term (current) use of insulin: Secondary | ICD-10-CM

## 2020-08-04 DIAGNOSIS — O09213 Supervision of pregnancy with history of pre-term labor, third trimester: Secondary | ICD-10-CM | POA: Diagnosis not present

## 2020-08-07 ENCOUNTER — Encounter: Payer: Medicaid Other | Admitting: Family Medicine

## 2020-08-07 ENCOUNTER — Encounter: Payer: Medicaid Other | Admitting: Obstetrics and Gynecology

## 2020-08-09 ENCOUNTER — Other Ambulatory Visit (HOSPITAL_COMMUNITY)
Admission: RE | Admit: 2020-08-09 | Discharge: 2020-08-09 | Disposition: A | Discharge status: Home / Self Care | Payer: Medicaid Other | Source: Ambulatory Visit | Attending: Family Medicine | Admitting: Family Medicine

## 2020-08-09 ENCOUNTER — Ambulatory Visit (INDEPENDENT_AMBULATORY_CARE_PROVIDER_SITE_OTHER): Payer: Medicaid Other | Admitting: Women's Health

## 2020-08-09 ENCOUNTER — Other Ambulatory Visit: Payer: Self-pay

## 2020-08-09 VITALS — BP 135/86 | HR 98 | Wt 270.0 lb

## 2020-08-09 DIAGNOSIS — Z3A35 35 weeks gestation of pregnancy: Secondary | ICD-10-CM

## 2020-08-09 DIAGNOSIS — O0990 Supervision of high risk pregnancy, unspecified, unspecified trimester: Secondary | ICD-10-CM

## 2020-08-09 DIAGNOSIS — O119 Pre-existing hypertension with pre-eclampsia, unspecified trimester: Secondary | ICD-10-CM

## 2020-08-09 DIAGNOSIS — O34219 Maternal care for unspecified type scar from previous cesarean delivery: Secondary | ICD-10-CM

## 2020-08-09 DIAGNOSIS — O24319 Unspecified pre-existing diabetes mellitus in pregnancy, unspecified trimester: Secondary | ICD-10-CM

## 2020-08-09 LAB — CBC
Hematocrit: 33.2 % — ABNORMAL LOW (ref 34.0–46.6)
Hemoglobin: 11.8 g/dL (ref 11.1–15.9)
MCH: 29.5 pg (ref 26.6–33.0)
MCHC: 35.5 g/dL (ref 31.5–35.7)
MCV: 83 fL (ref 79–97)
Platelets: 220 10*3/uL (ref 150–450)
RBC: 4 x10E6/uL (ref 3.77–5.28)
RDW: 13.1 % (ref 11.7–15.4)
WBC: 8 10*3/uL (ref 3.4–10.8)

## 2020-08-09 LAB — PROTEIN / CREATININE RATIO, URINE
Creatinine, Urine: 166.3 mg/dL
Protein, Ur: 1524.2 mg/dL
Protein/Creat Ratio: 9165 mg/g creat — ABNORMAL HIGH (ref 0–200)

## 2020-08-09 LAB — COMPREHENSIVE METABOLIC PANEL
ALT: 7 IU/L (ref 0–32)
AST: 9 IU/L (ref 0–40)
Albumin/Globulin Ratio: 0.9 — ABNORMAL LOW (ref 1.2–2.2)
Albumin: 2.9 g/dL — ABNORMAL LOW (ref 3.8–4.8)
Alkaline Phosphatase: 158 IU/L — ABNORMAL HIGH (ref 44–121)
BUN/Creatinine Ratio: 11 (ref 9–23)
BUN: 12 mg/dL (ref 6–20)
Bilirubin Total: <0.2 mg/dL (ref 0.0–1.2)
CO2: 19 mmol/L — ABNORMAL LOW (ref 20–29)
Calcium: 8.8 mg/dL (ref 8.7–10.2)
Chloride: 107 mmol/L — ABNORMAL HIGH (ref 96–106)
Creatinine, Ser: 1.06 mg/dL — ABNORMAL HIGH (ref 0.57–1.00)
Globulin, Total: 3.1 g/dL (ref 1.5–4.5)
Glucose: 106 mg/dL — ABNORMAL HIGH (ref 65–99)
Potassium: 4.6 mmol/L (ref 3.5–5.2)
Sodium: 140 mmol/L (ref 134–144)
Total Protein: 6 g/dL (ref 6.0–8.5)
eGFR: 72 mL/min/{1.73_m2} (ref >59)

## 2020-08-09 NOTE — Progress Notes (Signed)
Subjective:  Lindsay Burnett is a 30 y.o. [phone] at [redacted]w[redacted]d being seen today for ongoing prenatal care.  She is currently monitored for the following issues for this low-risk pregnancy and has Type 2 diabetes mellitus (HCC); Supervision of high risk pregnancy, antepartum; History of severe pre-eclampsia; History of preterm delivery, currently pregnant in second trimester; Previous cesarean delivery affecting pregnancy, antepartum; Pre-existing diabetes mellitus affecting pregnancy, antepartum; Chronic hypertension with superimposed pre-eclampsia; and Trichomonal vaginitis during pregnancy in second trimester on their problem list.  Patient reports no complaints.  Contractions: Irritability. Vag. Bleeding: None.  Movement: Present. Denies leaking of fluid.   The following portions of the patient's history were reviewed and updated as appropriate: allergies, current medications, past family history, past medical history, past social history, past surgical history and problem list. Problem list updated.  Objective:   Vitals:   08/09/20 0845  BP: 135/86  Pulse: 98  Weight: 270 lb (122.5 kg)    Fetal Status: Fetal Heart Rate (bpm): 153   Movement: Present  Presentation: Vertex  General:  Alert, oriented and cooperative. Patient is in no acute distress.  Skin: Skin is warm and dry. No rash noted.   Cardiovascular: Normal heart rate noted  Respiratory: Normal respiratory effort, no problems with respiration noted  Abdomen: Soft, gravid, appropriate for gestational age. Pain/Pressure: Absent     Pelvic: Vag. Bleeding: None     Cervical exam deferred        Extremities: Normal range of motion.  Edema: Trace  Mental Status: Normal mood and affect. Normal behavior. Normal judgment and thought content.   Urinalysis:      Assessment and Plan:  Pregnancy: [id number] at [redacted]w[redacted]d  1. Supervision of high risk pregnancy, antepartum -GBS/cultures performed today  2. Chronic hypertension with superimposed  pre-eclampsia -consulted with Dr. Donavan Foil regarding patient presentation and history, stat PEC labs ordered today -[place] scheduled 08/11/2020  3. Pre-existing diabetes mellitus affecting pregnancy, antepartum -IDDM pre-gestational, Followed by [facility] endocrinology, Seeing her regularly for medication adjustments -pt did not bring log with her today, but reports 2hr PP of 119 at highest with exception of 156 once and highest of 93 fasting, with fasting mostly in 80s. Patient encouraged to follow-up with [facility] regarding numbers  4. Previous cesarean delivery affecting pregnancy, antepartum -repeat C/S scheduled 08/12/2020, Dr. Donavan Foil states OK to proceed with this plan  5. [redacted] weeks gestation of pregnancy  Preterm labor symptoms and general obstetric precautions including but not limited to vaginal bleeding, contractions, leaking of fluid and fetal movement were reviewed in detail with the patient. I discussed the assessment and treatment plan with the patient. The patient was provided an opportunity to ask questions and all were answered. The patient agreed with the plan and demonstrated an understanding of the instructions. The patient was advised to call back or seek an in-person office evaluation/go to [facility] at [facility] for any urgent or concerning symptoms. Please refer to After Visit Summary for other counseling recommendations.  Return in 3 days (on 08/12/2020) for C/S scheduled.   Digby Groeneveld, Odie Sera, NP

## 2020-08-09 NOTE — Patient Instructions (Signed)
Maternity Assessment Unit (MAU)  The Maternity Assessment Unit (MAU) is located at the [facility] at [facility]. The address is: [address] [place], [place], [place] [address]. Please see map below for additional directions.    The Maternity Assessment Unit is designed to help you during your pregnancy, and for up to 6 weeks after delivery, with any pregnancy- or postpartum-related emergencies, if you think you are in labor, or if your water has broken. For example, if you experience nausea and vomiting, vaginal bleeding, severe abdominal or pelvic pain, elevated blood pressure or other problems related to your pregnancy or postpartum time, please come to the Maternity Assessment Unit for assistance.       Signs and Symptoms of Labor Labor is the body's natural process of moving the baby and the placenta out of the uterus. The process of labor usually starts when the baby is full-term,between 37 and 40 weeks of pregnancy. Signs and symptoms that you are close to going into labor As your body prepares for labor and the birth of your baby, you may notice the following symptoms in the weeks and days before true labor starts: Passing a small amount of thick, bloody mucus from your vagina. This is called normal bloody show or losing your mucus plug. This may happen more than a week before labor begins, or right before labor begins, as the opening of the cervix starts to widen (dilate). For some women, the entire mucus plug passes at once. For others, pieces of the mucus plug may gradually pass over several days. Your baby moving (dropping) lower in your pelvis to get into position for birth (lightening). When this happens, you may feel more pressure on your bladder and pelvic bone and less pressure on your ribs. This may make it easier to breathe. It may also cause you to need to urinate more often and have problems with bowel movements. Having "practice  contractions," also called Braxton Hicks contractions or false labor. These occur at irregular (unevenly spaced) intervals that are more than 10 minutes apart. False labor contractions are common after exercise or sexual activity. They will stop if you change position, rest, or drink fluids. These contractions are usually mild and do not get stronger over time. They may feel like: A backache or back pain. Mild cramps, similar to menstrual cramps. Tightening or pressure in your abdomen. Other early symptoms include: Nausea or loss of appetite. Diarrhea. Having a sudden burst of energy, or feeling very tired. Mood changes. Having trouble sleeping. Signs and symptoms that labor has begun Signs that you are in labor may include: Having contractions that come at regular (evenly spaced) intervals and increase in intensity. This may feel like more intense tightening or pressure in your abdomen that moves to your back. Contractions may also feel like rhythmic pain in your upper thighs or back that comes and goes at regular intervals. For first-time mothers, this change in intensity of contractions often occurs at a more gradual pace. Women who have given birth before may notice a more rapid progression of contraction changes. Feeling pressure in the vaginal area. Your water breaking (rupture of membranes). This is when the sac of fluid that surrounds your baby breaks. Fluid leaking from your vagina may be clear or blood-tinged. Labor usually starts within 24 hours of your water breaking, but it may take longer to begin. Some women may feel a sudden gush of fluid. Others notice that their underwear repeatedly becomes  damp. Follow these instructions at home:  When labor starts, or if your water breaks, call your health care provider or nurse care line. Based on your situation, they will determine when you should go in for an exam. During early labor, you may be able to rest and manage symptoms at home.  Some strategies to try at home include: Breathing and relaxation techniques. Taking a warm bath or shower. Listening to music. Using a heating pad on the lower back for pain. If you are directed to use heat: Place a towel between your skin and the heat source. Leave the heat on for 20-30 minutes. Remove the heat if your skin turns bright red. This is especially important if you are unable to feel pain, heat, or cold. You may have a greater risk of getting burned. Contact a health care provider if: Your labor has started. Your water breaks. Get help right away if: You have painful, regular contractions that are 5 minutes apart or less. Labor starts before you are [redacted] weeks along in your pregnancy. You have a fever. You have bright red blood coming from your vagina. You do not feel your baby moving. You have a severe headache with or without vision problems. You have severe nausea, vomiting, or diarrhea. You have chest pain or shortness of breath. These symptoms may represent a serious problem that is an emergency. Do not wait to see if the symptoms will go away. Get medical help right away. Call your local emergency services (911 in the [place] Do not drive yourself to the hospital. Summary Labor is your body's natural process of moving your baby and the placenta out of your uterus. The process of labor usually starts when your baby is full-term, between 33 and 40 weeks of pregnancy. When labor starts, or if your water breaks, call your health care provider or nurse care line. Based on your situation, they will determine when you should go in for an exam. This information is not intended to replace advice given to you by your health care provider. Make sure you discuss any questions you have with your healthcare provider. Document Revised: 10/16/2019 Document Reviewed: 10/16/2019 Elsevier Patient Education  2022 [employer].

## 2020-08-09 NOTE — Progress Notes (Signed)
+   Fetal movement. No complaints.  

## 2020-08-10 ENCOUNTER — Encounter (HOSPITAL_COMMUNITY)
Admission: RE | Admit: 2020-08-10 | Discharge: 2020-08-10 | Disposition: A | Discharge status: Home / Self Care | Payer: Medicaid Other | Source: Ambulatory Visit | Attending: Obstetrics & Gynecology | Admitting: Obstetrics & Gynecology

## 2020-08-10 HISTORY — DX: Gestational (pregnancy-induced) hypertension without significant proteinuria, unspecified trimester: O13.9

## 2020-08-10 HISTORY — DX: Encounter for other specified aftercare: Z51.89

## 2020-08-10 LAB — CERVICOVAGINAL ANCILLARY ONLY
Chlamydia: NEGATIVE
Comment: NEGATIVE
Comment: NORMAL
Neisseria Gonorrhea: NEGATIVE

## 2020-08-10 NOTE — Patient Instructions (Signed)
Lindsay Burnett  08/10/2020   Your procedure is scheduled on:  08/12/2020  Arrive at 0930 at Entrance C on [employer] at [facility]  and [employer]. You are invited to use the FREE valet parking or use the Visitor's parking deck.  Pick up the phone at the desk and dial [phone].  Call this number if you have problems the morning of surgery: [phone]  Remember:   Do not eat food:(After Midnight) Desps de medianoche.  Do not drink clear liquids: (After Midnight) Desps de medianoche.  Take these medicines the morning of surgery with A SIP OF WATER:  Take 10 units of lantus insulin the night before surgery.  No insulin the day of surgery.  Take Nifedipine as prescribed.   Do not wear jewelry, make-up or nail polish.  Do not wear lotions, powders, or perfumes. Do not wear deodorant.  Do not shave 48 hours prior to surgery.  Do not bring valuables to the hospital.  [facility] is not   responsible for any belongings or valuables brought to the hospital.  Contacts, dentures or bridgework may not be worn into surgery.  Leave suitcase in the car. After surgery it may be brought to your room.  For patients admitted to the hospital, checkout time is 11:00 AM the day of              discharge.      Please read over the following fact sheets that you were given:     Preparing for Surgery

## 2020-08-11 ENCOUNTER — Other Ambulatory Visit: Payer: Self-pay

## 2020-08-11 ENCOUNTER — Ambulatory Visit (HOSPITAL_BASED_OUTPATIENT_CLINIC_OR_DEPARTMENT_OTHER): Payer: Medicaid Other

## 2020-08-11 ENCOUNTER — Encounter: Payer: Self-pay | Admitting: *Deleted

## 2020-08-11 ENCOUNTER — Other Ambulatory Visit: Payer: Self-pay | Admitting: Obstetrics & Gynecology

## 2020-08-11 ENCOUNTER — Ambulatory Visit: Payer: Medicaid Other | Admitting: *Deleted

## 2020-08-11 ENCOUNTER — Other Ambulatory Visit (HOSPITAL_COMMUNITY)
Admission: RE | Admit: 2020-08-11 | Discharge: 2020-08-11 | Disposition: A | Discharge status: Home / Self Care | Payer: Medicaid Other | Source: Ambulatory Visit | Attending: Obstetrics & Gynecology | Admitting: Obstetrics & Gynecology

## 2020-08-11 VITALS — BP 143/83 | HR 97

## 2020-08-11 DIAGNOSIS — O10013 Pre-existing essential hypertension complicating pregnancy, third trimester: Secondary | ICD-10-CM | POA: Diagnosis not present

## 2020-08-11 DIAGNOSIS — O09293 Supervision of pregnancy with other poor reproductive or obstetric history, third trimester: Secondary | ICD-10-CM | POA: Insufficient documentation

## 2020-08-11 DIAGNOSIS — Z794 Long term (current) use of insulin: Secondary | ICD-10-CM

## 2020-08-11 DIAGNOSIS — O24112 Pre-existing diabetes mellitus, type 2, in pregnancy, second trimester: Secondary | ICD-10-CM

## 2020-08-11 DIAGNOSIS — O24113 Pre-existing diabetes mellitus, type 2, in pregnancy, third trimester: Secondary | ICD-10-CM | POA: Insufficient documentation

## 2020-08-11 DIAGNOSIS — O24319 Unspecified pre-existing diabetes mellitus in pregnancy, unspecified trimester: Secondary | ICD-10-CM

## 2020-08-11 DIAGNOSIS — O34219 Maternal care for unspecified type scar from previous cesarean delivery: Secondary | ICD-10-CM

## 2020-08-11 DIAGNOSIS — O10913 Unspecified pre-existing hypertension complicating pregnancy, third trimester: Secondary | ICD-10-CM | POA: Insufficient documentation

## 2020-08-11 DIAGNOSIS — E119 Type 2 diabetes mellitus without complications: Secondary | ICD-10-CM

## 2020-08-11 DIAGNOSIS — O09213 Supervision of pregnancy with history of pre-term labor, third trimester: Secondary | ICD-10-CM

## 2020-08-11 DIAGNOSIS — Z3A36 36 weeks gestation of pregnancy: Secondary | ICD-10-CM | POA: Insufficient documentation

## 2020-08-11 DIAGNOSIS — O0990 Supervision of high risk pregnancy, unspecified, unspecified trimester: Secondary | ICD-10-CM

## 2020-08-11 DIAGNOSIS — O09892 Supervision of other high risk pregnancies, second trimester: Secondary | ICD-10-CM

## 2020-08-11 DIAGNOSIS — O119 Pre-existing hypertension with pre-eclampsia, unspecified trimester: Secondary | ICD-10-CM

## 2020-08-11 LAB — CBC
HCT: 31.6 % — ABNORMAL LOW (ref 36.0–46.0)
Hemoglobin: 10.5 g/dL — ABNORMAL LOW (ref 12.0–15.0)
MCH: 29.2 pg (ref 26.0–34.0)
MCHC: 33.2 g/dL (ref 30.0–36.0)
MCV: 87.8 fL (ref 80.0–100.0)
Platelets: 188 10*3/uL (ref 150–400)
RBC: 3.6 MIL/uL — ABNORMAL LOW (ref 3.87–5.11)
RDW: 13.3 % (ref 11.5–15.5)
WBC: 7.5 10*3/uL (ref 4.0–10.5)
nRBC: 0 % (ref 0.0–0.2)

## 2020-08-11 LAB — TYPE AND SCREEN
ABO/RH(D): B POS
Antibody Screen: NEGATIVE

## 2020-08-11 NOTE — H&P (Signed)
OBSTETRIC ADMISSION HISTORY AND PHYSICAL  Lindsay Burnett is a 30 y.o. female [phone] with IUP at 53w0dby LMP presenting for scheduled rLTCS (type 2 diabetes on insulin and cHTN with super imposed pre-eclampsia). She reports +FMs, No LOF, no VB, no blurry vision, headaches or peripheral edema, and RUQ pain.  She plans on breast and bottle feeding. She request  post placental liletta for birth control. She received her prenatal care at  [facility] --->  Estimated Date of Delivery: 09/08/20  Sono:   _0 , CWD, normal anatomy, cephalic presentation, placenta anterior lie, 2391g, 27% EFW   Prenatal History/Complications:  Type 2 diabetes on insulin Chronic HTN with superimposed pre-eclampsia  Past Medical History: Past Medical History:  Diagnosis Date   Blood transfusion without reported diagnosis    Diabetes mellitus without complication ([place]    Pregnancy induced hypertension     Past Surgical History: Past Surgical History:  Procedure Laterality Date   CESAREAN SECTION     2 cesarean sections    Obstetrical History: OB History     Gravida  7   Para  3   Term  1   Preterm  2   AB  3   Living  3      SAB  3   IAB  0   Ectopic  0   Multiple  0   Live Births  3           Social History Social History   Socioeconomic History   Marital status: Divorced    Spouse name: Not on file   Number of children: Not on file   Years of education: Not on file   Highest education level: Not on file  Occupational History   Not on file  Tobacco Use   Smoking status: Every Day    Packs/day: 0.50    Years: 4.00    Pack years: 2.00    Types: Cigarettes   Smokeless tobacco: Never  Vaping Use   Vaping Use: Never used  Substance and Sexual Activity   Alcohol use: Not Currently    Comment: occ   Drug use: Never   Sexual activity: Yes    Birth control/protection: Condom  Other Topics Concern   Not on file  Social History Narrative   Not on  file   Social Determinants of Health   Financial Resource Strain: Not on file  Food Insecurity: Not on file  Transportation Needs: Not on file  Physical Activity: Not on file  Stress: Not on file  Social Connections: Not on file    Family History: Family History  Problem Relation Age of Onset   Diabetes Mother    Diabetes Maternal Grandmother     Allergies: Allergies  Allergen Reactions   Compazine [Prochlorperazine Edisylate]     "Stroke like symptoms"   Reglan [Metoclopramide]     "Stroke like symptoms"    Medications Prior to Admission  Medication Sig Dispense Refill Last Dose   aspirin EC 81 MG tablet Take 1 tablet (81 mg total) by mouth daily. Swallow whole. 30 tablet 11    cyclobenzaprine (FLEXERIL) 10 MG tablet Take 0.5-1 tablets (5-10 mg total) by mouth 3 (three) times daily as needed for muscle spasms. (Patient not taking: Reported on 08/11/2020) 30 tablet 0    insulin glargine (LANTUS SOLOSTAR) 100 UNIT/ML Solostar Pen Inject 20 Units into the skin at bedtime.      insulin lispro (HUMALOG) 100 UNIT/ML KwikPen  Inject 5-8 Units into the skin 3 (three) times daily with meals.      NIFEdipine (PROCARDIA-XL/NIFEDICAL-XL) 30 MG 24 hr tablet Take 1 tablet (30 mg total) by mouth daily. 30 tablet 2    Prenatal MV & Min w/FA-DHA (PRENATAL ADULT GUMMY/DHA/FA PO) Take 1 tablet by mouth daily.      Blood Glucose Monitoring Suppl (GLUCOCOM BLOOD GLUCOSE MONITOR) DEVI Used to check Blood sugars 4 times daily. Dx. Code E11.65      Blood Pressure Monitoring (BLOOD PRESSURE KIT) DEVI 1 Device by Does not apply route as needed. 1 each 0    Continuous Blood Gluc Sensor (DEXCOM G6 SENSOR) MISC INJECT 1 SENSOR TO THE SKIN EVERY 10 DAYS FOR CONTINUOUS GLUCOSE MONITORING.      Continuous Blood Gluc Transmit (DEXCOM G6 TRANSMITTER) MISC Use as directed for continuous glucose monitoring. Reuse transmitter for 90 days then discard and replace.      ferrous sulfate (FERROUSUL) 325 (65 FE) MG  tablet Take 1 tablet (325 mg total) by mouth 2 (two) times daily. (Patient not taking: No sig reported) 60 tablet 1 Not Taking   Insulin Pen Needle (BD PEN NEEDLE NANO U/F) 32G X 4 MM MISC Use to administer insulin 4 times daily        Review of Systems  All systems reviewed and negative except as stated in HPI  Blood pressure (!) 143/93, pulse (!) 107, temperature 98.9 F (37.2 C), temperature source Oral, resp. rate 18, last menstrual period 12/03/2019, SpO2 100 %. General appearance: alert and cooperative Lungs: clear to auscultation bilaterally Heart: regular rate and rhythm Abdomen: soft, non-tender Pelvic: deferred Extremities: Homans sign is negative, no sign of DVT Presentation: cephalic Uterine activity: irregular     Prenatal labs: ABO, Rh: --/--/B POS (08/05 1027) Antibody: NEG (08/05 1027) Rubella: 1.23 (02/25 1033) RPR: Non Reactive (05/24 1431)  HBsAg: Negative (02/25 1033)  HIV: Non Reactive (05/24 1431)  GBS:   unknown, plan for pre-op antibiotics Early a1c 6.0, GTT N/A given known history of Type 2 DM on insulin Genetic screening  low risk  Prenatal Transfer Tool  Maternal Diabetes: Yes:  Diabetes Type:  Pre-pregnancy, Insulin/Medication controlled Genetic Screening: Normal Maternal Ultrasounds/Referrals: Normal Fetal Ultrasounds or other Referrals:  None Maternal Substance Abuse:  No Significant Maternal Medications:  insulin, Nifedipine, ASA Significant Maternal Lab Results: GBS unknown  Results for orders placed or performed during the hospital encounter of 08/11/20 (from the past 24 hour(s))  CBC   Collection Time: 08/11/20 10:14 AM  Result Value Ref Range   WBC 7.5 4.0 - 10.5 K/uL   RBC 3.60 (L) 3.87 - 5.11 MIL/uL   Hemoglobin 10.5 (L) 12.0 - 15.0 g/dL   HCT 31.6 (L) 36.0 - 46.0 %   MCV 87.8 80.0 - 100.0 fL   MCH 29.2 26.0 - 34.0 pg   MCHC 33.2 30.0 - 36.0 g/dL   RDW 13.3 11.5 - 15.5 %   Platelets 188 150 - 400 K/uL   nRBC 0.0 0.0 - 0.2 %   Type and screen   Collection Time: 08/11/20 10:27 AM  Result Value Ref Range   ABO/RH(D) B POS    Antibody Screen NEG    Sample Expiration      08/14/2020,2359 Performed at [facility], [place] [address] [place], [place] [address]     Patient Active Problem List   Diagnosis Date Noted   History of cesarean delivery 08/12/2020   Trichomonal vaginitis during pregnancy in  second trimester 07/24/2020   Pre-existing diabetes mellitus affecting pregnancy, antepartum 05/02/2020   Chronic hypertension with superimposed pre-eclampsia 05/02/2020   History of severe pre-eclampsia 03/28/2020   History of preterm delivery, currently pregnant in second trimester 03/28/2020   Previous cesarean delivery affecting pregnancy, antepartum 03/28/2020   Supervision of high risk pregnancy, antepartum 02/25/2020   Type 2 diabetes mellitus ([place]) 01/12/2019    Assessment/Plan:  Lindsay Burnett is a 30 y.o. [id number] at 59w0dhere for scheduled rLTCS ( Repeat Cesarean section - scheduled The risks of cesarean section were discussed with the patient including but were not limited to: bleeding which may require transfusion or reoperation; infection which may require antibiotics; injury to bowel, bladder, ureters or other surrounding organs; injury to the fetus; need for additional procedures including hysterectomy in the event of a life-threatening hemorrhage; placental abnormalities wth subsequent pregnancies, incisional problems, thromboembolic phenomenon and other postoperative/anesthesia complications.  The patient concurred with the proposed plan, giving informed written consent for the procedures.  Patient has been NPO since 10PM she will remain NPO for procedure. Anesthesia and OR aware.  Preoperative prophylactic antibiotics and SCDs ordered on call to the OR.  To OR when ready.  #ID:  GBS unknown, will receive pre-op abx #MOF: breast and bottle #MOC:post placental liletta IUD #Circ:  yes  2.  Contraceptive management Patient desires post placental liletta IUD placement at time of c-section. Discussed risks including higher risk of expulsion and patient expressed understanding. - Plan  to place IUD at time of c-section  3. Type 2 diabetes Home medications: continue home insulin regimen  4. History of cHTN with super imposed preE No severe features at this time. P:c 96811on 8/3. BP here continues to be in mild range. - continue Procardia 30XL daily - monitor blood pressures  ARenard Matter MD, MPH OB Fellow, Faculty Practice

## 2020-08-12 ENCOUNTER — Inpatient Hospital Stay (HOSPITAL_COMMUNITY): Payer: Medicaid Other | Admitting: Anesthesiology

## 2020-08-12 ENCOUNTER — Encounter (HOSPITAL_COMMUNITY): Payer: Self-pay | Admitting: Obstetrics and Gynecology

## 2020-08-12 ENCOUNTER — Other Ambulatory Visit: Payer: Self-pay

## 2020-08-12 ENCOUNTER — Encounter (HOSPITAL_COMMUNITY)
Admission: RE | Disposition: A | Discharge status: Home / Self Care | Payer: Self-pay | Source: Home / Self Care | Attending: Obstetrics & Gynecology

## 2020-08-12 ENCOUNTER — Inpatient Hospital Stay (HOSPITAL_COMMUNITY)
Admission: RE | Admit: 2020-08-12 | Discharge: 2020-08-15 | DRG: 787 | Disposition: A | Discharge status: Home / Self Care | Payer: Medicaid Other | Attending: Obstetrics & Gynecology | Admitting: Obstetrics & Gynecology

## 2020-08-12 DIAGNOSIS — O24424 Gestational diabetes mellitus in childbirth, insulin controlled: Secondary | ICD-10-CM | POA: Diagnosis not present

## 2020-08-12 DIAGNOSIS — O2412 Pre-existing diabetes mellitus, type 2, in childbirth: Secondary | ICD-10-CM | POA: Diagnosis present

## 2020-08-12 DIAGNOSIS — O99334 Smoking (tobacco) complicating childbirth: Secondary | ICD-10-CM | POA: Diagnosis present

## 2020-08-12 DIAGNOSIS — Z30430 Encounter for insertion of intrauterine contraceptive device: Secondary | ICD-10-CM | POA: Diagnosis not present

## 2020-08-12 DIAGNOSIS — O1002 Pre-existing essential hypertension complicating childbirth: Secondary | ICD-10-CM | POA: Diagnosis present

## 2020-08-12 DIAGNOSIS — O119 Pre-existing hypertension with pre-eclampsia, unspecified trimester: Secondary | ICD-10-CM | POA: Diagnosis present

## 2020-08-12 DIAGNOSIS — N179 Acute kidney failure, unspecified: Secondary | ICD-10-CM | POA: Diagnosis present

## 2020-08-12 DIAGNOSIS — Z7982 Long term (current) use of aspirin: Secondary | ICD-10-CM

## 2020-08-12 DIAGNOSIS — O1414 Severe pre-eclampsia complicating childbirth: Secondary | ICD-10-CM | POA: Diagnosis not present

## 2020-08-12 DIAGNOSIS — O99214 Obesity complicating childbirth: Secondary | ICD-10-CM | POA: Diagnosis present

## 2020-08-12 DIAGNOSIS — O34219 Maternal care for unspecified type scar from previous cesarean delivery: Secondary | ICD-10-CM | POA: Diagnosis present

## 2020-08-12 DIAGNOSIS — O114 Pre-existing hypertension with pre-eclampsia, complicating childbirth: Secondary | ICD-10-CM | POA: Diagnosis present

## 2020-08-12 DIAGNOSIS — Z98891 History of uterine scar from previous surgery: Secondary | ICD-10-CM | POA: Diagnosis not present

## 2020-08-12 DIAGNOSIS — O0990 Supervision of high risk pregnancy, unspecified, unspecified trimester: Secondary | ICD-10-CM

## 2020-08-12 DIAGNOSIS — O99891 Other specified diseases and conditions complicating pregnancy: Secondary | ICD-10-CM

## 2020-08-12 DIAGNOSIS — O34211 Maternal care for low transverse scar from previous cesarean delivery: Secondary | ICD-10-CM | POA: Diagnosis present

## 2020-08-12 DIAGNOSIS — Z794 Long term (current) use of insulin: Secondary | ICD-10-CM

## 2020-08-12 DIAGNOSIS — O09892 Supervision of other high risk pregnancies, second trimester: Secondary | ICD-10-CM

## 2020-08-12 DIAGNOSIS — F17210 Nicotine dependence, cigarettes, uncomplicated: Secondary | ICD-10-CM | POA: Diagnosis present

## 2020-08-12 DIAGNOSIS — O24319 Unspecified pre-existing diabetes mellitus in pregnancy, unspecified trimester: Secondary | ICD-10-CM | POA: Diagnosis present

## 2020-08-12 DIAGNOSIS — E119 Type 2 diabetes mellitus without complications: Secondary | ICD-10-CM | POA: Diagnosis present

## 2020-08-12 DIAGNOSIS — Z3A36 36 weeks gestation of pregnancy: Secondary | ICD-10-CM

## 2020-08-12 DIAGNOSIS — M549 Dorsalgia, unspecified: Secondary | ICD-10-CM

## 2020-08-12 LAB — GLUCOSE, CAPILLARY
Glucose-Capillary: 118 mg/dL — ABNORMAL HIGH (ref 70–99)
Glucose-Capillary: 100 mg/dL — ABNORMAL HIGH (ref 70–99)
Glucose-Capillary: 126 mg/dL — ABNORMAL HIGH (ref 70–99)
Glucose-Capillary: 114 mg/dL — ABNORMAL HIGH (ref 70–99)

## 2020-08-12 LAB — SARS CORONAVIRUS 2 (TAT 6-24 HRS): SARS Coronavirus 2: NEGATIVE

## 2020-08-12 LAB — RPR: RPR Ser Ql: NONREACTIVE

## 2020-08-12 SURGERY — Surgical Case
Anesthesia: Spinal

## 2020-08-12 MED ORDER — FENTANYL CITRATE (PF) 100 MCG/2ML IJ SOLN
INTRAMUSCULAR | Status: DC | PRN
  Administered 2020-08-12: 15 ug via INTRATHECAL

## 2020-08-12 MED ORDER — KETOROLAC TROMETHAMINE 30 MG/ML IJ SOLN
INTRAMUSCULAR | Status: AC
  Filled 2020-08-12: qty 1

## 2020-08-12 MED ORDER — MORPHINE SULFATE (PF) 0.5 MG/ML IJ SOLN
INTRAMUSCULAR | Status: DC | PRN
  Administered 2020-08-12: 150 ug via INTRATHECAL

## 2020-08-12 MED ORDER — NALOXONE HCL 0.4 MG/ML IJ SOLN: 0.4000 mg | INTRAMUSCULAR | Status: DC | PRN

## 2020-08-12 MED ORDER — HYDROMORPHONE HCL 1 MG/ML IJ SOLN
INTRAMUSCULAR | Status: AC
  Filled 2020-08-12: qty 0.5

## 2020-08-12 MED ORDER — NALBUPHINE HCL 10 MG/ML IJ SOLN
5.0000 mg | INTRAMUSCULAR | Status: DC | PRN
  Filled 2020-08-12: qty 1

## 2020-08-12 MED ORDER — SODIUM CHLORIDE 0.9 % IR SOLN
Status: DC | PRN
  Administered 2020-08-12: 1

## 2020-08-12 MED ORDER — HYDROMORPHONE HCL 1 MG/ML IJ SOLN
0.2500 mg | INTRAMUSCULAR | Status: DC | PRN
  Administered 2020-08-12: 0.5 mg via INTRAVENOUS

## 2020-08-12 MED ORDER — LEVONORGESTREL 20.1 MCG/DAY IU IUD
INTRAUTERINE_SYSTEM | INTRAUTERINE | Status: AC
  Filled 2020-08-12: qty 1

## 2020-08-12 MED ORDER — LACTATED RINGERS IV SOLN: INTRAVENOUS | Status: DC

## 2020-08-12 MED ORDER — MORPHINE SULFATE (PF) 0.5 MG/ML IJ SOLN
INTRAMUSCULAR | Status: AC
  Filled 2020-08-12: qty 10

## 2020-08-12 MED ORDER — SOD CITRATE-CITRIC ACID 500-334 MG/5ML PO SOLN
30.0000 mL | ORAL | Status: AC
  Administered 2020-08-12: 30 mL via ORAL

## 2020-08-12 MED ORDER — NALBUPHINE HCL 10 MG/ML IJ SOLN
5.0000 mg | INTRAMUSCULAR | Status: DC | PRN
  Administered 2020-08-13: 5 mg via INTRAVENOUS
  Filled 2020-08-12: qty 1

## 2020-08-12 MED ORDER — MEASLES, MUMPS & RUBELLA VAC IJ SOLR: 0.5000 mL | Freq: Once | INTRAMUSCULAR | Status: DC

## 2020-08-12 MED ORDER — OXYCODONE HCL 5 MG/5ML PO SOLN
5.0000 mg | Freq: Once | ORAL | Status: DC | PRN
Start: 2020-08-12 — End: 2020-08-12

## 2020-08-12 MED ORDER — COCONUT OIL OIL: 1.0000 "application " | TOPICAL_OIL | Status: DC | PRN

## 2020-08-12 MED ORDER — CEFAZOLIN IN SODIUM CHLORIDE 3-0.9 GM/100ML-% IV SOLN
3.0000 g | INTRAVENOUS | Status: AC
  Administered 2020-08-12: 3 g via INTRAVENOUS

## 2020-08-12 MED ORDER — AMISULPRIDE (ANTIEMETIC) 5 MG/2ML IV SOLN
5.0000 mg | Freq: Once | INTRAVENOUS | Status: AC
  Administered 2020-08-12: 5 mg via INTRAVENOUS
  Filled 2020-08-12 (×2): qty 2

## 2020-08-12 MED ORDER — OXYTOCIN-SODIUM CHLORIDE 30-0.9 UT/500ML-% IV SOLN
INTRAVENOUS | Status: AC
  Filled 2020-08-12: qty 500

## 2020-08-12 MED ORDER — OXYTOCIN-SODIUM CHLORIDE 30-0.9 UT/500ML-% IV SOLN
2.5000 [IU]/h | INTRAVENOUS | Status: AC
  Administered 2020-08-12: 2.5 [IU]/h via INTRAVENOUS
  Filled 2020-08-12: qty 500

## 2020-08-12 MED ORDER — MEPERIDINE HCL 25 MG/ML IJ SOLN: 6.2500 mg | INTRAMUSCULAR | Status: DC | PRN

## 2020-08-12 MED ORDER — ACETAMINOPHEN 500 MG PO TABS
1000.0000 mg | ORAL_TABLET | Freq: Four times a day (QID) | ORAL | Status: DC
  Administered 2020-08-13 – 2020-08-15 (×10): 1000 mg via ORAL
  Filled 2020-08-12 (×11): qty 2

## 2020-08-12 MED ORDER — LACTATED RINGERS IV SOLN: INTRAVENOUS | Status: DC | PRN

## 2020-08-12 MED ORDER — STERILE WATER FOR IRRIGATION IR SOLN
Status: DC | PRN
  Administered 2020-08-12: 1

## 2020-08-12 MED ORDER — DIPHENHYDRAMINE HCL 50 MG/ML IJ SOLN: 12.5000 mg | INTRAMUSCULAR | Status: DC | PRN

## 2020-08-12 MED ORDER — ZOLPIDEM TARTRATE 5 MG PO TABS: 5.0000 mg | ORAL_TABLET | Freq: Every evening | ORAL | Status: DC | PRN

## 2020-08-12 MED ORDER — CEFAZOLIN IN SODIUM CHLORIDE 3-0.9 GM/100ML-% IV SOLN
INTRAVENOUS | Status: AC
  Filled 2020-08-12: qty 100

## 2020-08-12 MED ORDER — KETOROLAC TROMETHAMINE 30 MG/ML IJ SOLN
30.0000 mg | Freq: Four times a day (QID) | INTRAMUSCULAR | Status: AC
  Administered 2020-08-12 – 2020-08-13 (×4): 30 mg via INTRAVENOUS
  Filled 2020-08-12 (×4): qty 1

## 2020-08-12 MED ORDER — FERROUS SULFATE 325 (65 FE) MG PO TABS
325.0000 mg | ORAL_TABLET | ORAL | Status: DC
  Administered 2020-08-13 – 2020-08-15 (×2): 325 mg via ORAL
  Filled 2020-08-12 (×2): qty 1

## 2020-08-12 MED ORDER — TRANEXAMIC ACID-NACL 1000-0.7 MG/100ML-% IV SOLN
INTRAVENOUS | Status: AC
  Filled 2020-08-12: qty 100

## 2020-08-12 MED ORDER — ONDANSETRON HCL 4 MG/2ML IJ SOLN
INTRAMUSCULAR | Status: AC
  Filled 2020-08-12: qty 2

## 2020-08-12 MED ORDER — SIMETHICONE 80 MG PO CHEW: 80.0000 mg | CHEWABLE_TABLET | ORAL | Status: DC | PRN

## 2020-08-12 MED ORDER — SCOPOLAMINE 1 MG/3DAYS TD PT72
MEDICATED_PATCH | TRANSDERMAL | Status: AC
  Filled 2020-08-12: qty 1

## 2020-08-12 MED ORDER — MAGNESIUM HYDROXIDE 400 MG/5ML PO SUSP: 30.0000 mL | ORAL | Status: DC | PRN

## 2020-08-12 MED ORDER — WITCH HAZEL-GLYCERIN EX PADS: 1.0000 "application " | MEDICATED_PAD | CUTANEOUS | Status: DC | PRN

## 2020-08-12 MED ORDER — SOD CITRATE-CITRIC ACID 500-334 MG/5ML PO SOLN
ORAL | Status: AC
  Filled 2020-08-12: qty 30

## 2020-08-12 MED ORDER — TRANEXAMIC ACID-NACL 1000-0.7 MG/100ML-% IV SOLN
INTRAVENOUS | Status: DC | PRN
  Administered 2020-08-12: 1000 mg via INTRAVENOUS

## 2020-08-12 MED ORDER — LEVONORGESTREL 20.1 MCG/DAY IU IUD
1.0000 | INTRAUTERINE_SYSTEM | Freq: Once | INTRAUTERINE | Status: AC
  Administered 2020-08-12: 1 via INTRAUTERINE

## 2020-08-12 MED ORDER — PHENYLEPHRINE 40 MCG/ML (10ML) SYRINGE FOR IV PUSH (FOR BLOOD PRESSURE SUPPORT): PREFILLED_SYRINGE | INTRAVENOUS | Status: DC | PRN

## 2020-08-12 MED ORDER — INSULIN ASPART 100 UNIT/ML IJ SOLN: 0.0000 [IU] | Freq: Three times a day (TID) | INTRAMUSCULAR | Status: DC

## 2020-08-12 MED ORDER — INSULIN ASPART 100 UNIT/ML IJ SOLN: 0.0000 [IU] | Freq: Every day | INTRAMUSCULAR | Status: DC

## 2020-08-12 MED ORDER — NALBUPHINE HCL 10 MG/ML IJ SOLN
5.0000 mg | Freq: Once | INTRAMUSCULAR | Status: AC | PRN
  Administered 2020-08-12: 5 mg via INTRAVENOUS

## 2020-08-12 MED ORDER — IBUPROFEN 600 MG PO TABS: 600.0000 mg | ORAL_TABLET | Freq: Four times a day (QID) | ORAL | Status: DC

## 2020-08-12 MED ORDER — DEXTROSE 5 % IV SOLN
1.0000 ug/kg/h | INTRAVENOUS | Status: DC | PRN
  Filled 2020-08-12: qty 5

## 2020-08-12 MED ORDER — NALBUPHINE HCL 10 MG/ML IJ SOLN: 5.0000 mg | Freq: Once | INTRAMUSCULAR | Status: AC | PRN

## 2020-08-12 MED ORDER — DIPHENHYDRAMINE HCL 25 MG PO CAPS
25.0000 mg | ORAL_CAPSULE | ORAL | Status: DC | PRN
  Filled 2020-08-12 (×2): qty 1

## 2020-08-12 MED ORDER — OXYCODONE HCL 5 MG PO TABS
5.0000 mg | ORAL_TABLET | ORAL | Status: DC | PRN
  Administered 2020-08-13 (×2): 10 mg via ORAL
  Administered 2020-08-14: 5 mg via ORAL
  Administered 2020-08-15: 10 mg via ORAL
  Filled 2020-08-12 (×3): qty 2
  Filled 2020-08-12: qty 1

## 2020-08-12 MED ORDER — NIFEDIPINE ER OSMOTIC RELEASE 30 MG PO TB24
30.0000 mg | ORAL_TABLET | Freq: Every day | ORAL | Status: DC
  Administered 2020-08-12 – 2020-08-15 (×4): 30 mg via ORAL
  Filled 2020-08-12 (×5): qty 1

## 2020-08-12 MED ORDER — HYDROMORPHONE HCL 1 MG/ML IJ SOLN
1.0000 mg | INTRAMUSCULAR | Status: DC | PRN
  Administered 2020-08-14 – 2020-08-15 (×2): 2 mg via INTRAVENOUS
  Filled 2020-08-12 (×2): qty 2

## 2020-08-12 MED ORDER — INSULIN GLARGINE 100 UNIT/ML SOLOSTAR PEN: 10.0000 [IU] | PEN_INJECTOR | Freq: Every day | SUBCUTANEOUS | Status: DC

## 2020-08-12 MED ORDER — ENOXAPARIN SODIUM 60 MG/0.6ML IJ SOSY
60.0000 mg | PREFILLED_SYRINGE | INTRAMUSCULAR | Status: DC
  Administered 2020-08-13 – 2020-08-14 (×2): 60 mg via SUBCUTANEOUS
  Filled 2020-08-12 (×2): qty 0.6

## 2020-08-12 MED ORDER — TETANUS-DIPHTH-ACELL PERTUSSIS 5-2.5-18.5 LF-MCG/0.5 IM SUSY: 0.5000 mL | PREFILLED_SYRINGE | Freq: Once | INTRAMUSCULAR | Status: DC

## 2020-08-12 MED ORDER — OXYCODONE-ACETAMINOPHEN 5-325 MG PO TABS
2.0000 | ORAL_TABLET | ORAL | Status: DC | PRN
  Filled 2020-08-12: qty 2

## 2020-08-12 MED ORDER — SCOPOLAMINE 1 MG/3DAYS TD PT72
1.0000 | MEDICATED_PATCH | TRANSDERMAL | Status: DC
  Administered 2020-08-12: 1.5 mg via TRANSDERMAL
  Filled 2020-08-12: qty 1

## 2020-08-12 MED ORDER — PRENATAL MULTIVITAMIN CH
1.0000 | ORAL_TABLET | Freq: Every day | ORAL | Status: DC
  Administered 2020-08-13 – 2020-08-15 (×3): 1 via ORAL
  Filled 2020-08-12 (×3): qty 1

## 2020-08-12 MED ORDER — ONDANSETRON HCL 4 MG/2ML IJ SOLN
INTRAMUSCULAR | Status: DC | PRN
  Administered 2020-08-12: 4 mg via INTRAVENOUS

## 2020-08-12 MED ORDER — GABAPENTIN 300 MG PO CAPS
300.0000 mg | ORAL_CAPSULE | Freq: Two times a day (BID) | ORAL | Status: DC
  Administered 2020-08-12 – 2020-08-15 (×6): 300 mg via ORAL
  Filled 2020-08-12: qty 1
  Filled 2020-08-12 (×4): qty 3
  Filled 2020-08-12: qty 1

## 2020-08-12 MED ORDER — OXYTOCIN-SODIUM CHLORIDE 30-0.9 UT/500ML-% IV SOLN
INTRAVENOUS | Status: DC | PRN
Start: 2020-08-12 — End: 2020-08-13
  Administered 2020-08-12: 300 mL via INTRAVENOUS

## 2020-08-12 MED ORDER — SENNOSIDES-DOCUSATE SODIUM 8.6-50 MG PO TABS
2.0000 | ORAL_TABLET | Freq: Every day | ORAL | Status: DC
  Administered 2020-08-13 – 2020-08-15 (×3): 2 via ORAL
  Filled 2020-08-12 (×3): qty 2

## 2020-08-12 MED ORDER — KETOROLAC TROMETHAMINE 30 MG/ML IJ SOLN
30.0000 mg | Freq: Once | INTRAMUSCULAR | Status: AC | PRN
  Administered 2020-08-12: 30 mg via INTRAVENOUS

## 2020-08-12 MED ORDER — MENTHOL 3 MG MT LOZG: 1.0000 | LOZENGE | OROMUCOSAL | Status: DC | PRN

## 2020-08-12 MED ORDER — FENTANYL CITRATE (PF) 100 MCG/2ML IJ SOLN
INTRAMUSCULAR | Status: AC
  Filled 2020-08-12: qty 2

## 2020-08-12 MED ORDER — SODIUM CHLORIDE 0.9% FLUSH: 3.0000 mL | INTRAVENOUS | Status: DC | PRN

## 2020-08-12 MED ORDER — DIPHENHYDRAMINE HCL 25 MG PO CAPS
25.0000 mg | ORAL_CAPSULE | Freq: Four times a day (QID) | ORAL | Status: DC | PRN
  Administered 2020-08-13 – 2020-08-14 (×2): 25 mg via ORAL

## 2020-08-12 MED ORDER — INSULIN GLARGINE-YFGN 100 UNIT/ML ~~LOC~~ SOLN
10.0000 [IU] | Freq: Every day | SUBCUTANEOUS | Status: DC
Start: 2020-08-12 — End: 2020-08-15
  Administered 2020-08-14: 10 [IU] via SUBCUTANEOUS
  Filled 2020-08-12 (×4): qty 0.1

## 2020-08-12 MED ORDER — OXYCODONE HCL 5 MG PO TABS
5.0000 mg | ORAL_TABLET | Freq: Once | ORAL | Status: DC | PRN
Start: 2020-08-12 — End: 2020-08-12

## 2020-08-12 MED ORDER — PHENYLEPHRINE HCL-NACL 20-0.9 MG/250ML-% IV SOLN
INTRAVENOUS | Status: DC | PRN
  Administered 2020-08-12: 30 ug/min via INTRAVENOUS

## 2020-08-12 MED ORDER — PHENYLEPHRINE HCL-NACL 20-0.9 MG/250ML-% IV SOLN
INTRAVENOUS | Status: AC
  Filled 2020-08-12: qty 250

## 2020-08-12 MED ORDER — DIBUCAINE (PERIANAL) 1 % EX OINT: 1.0000 "application " | TOPICAL_OINTMENT | CUTANEOUS | Status: DC | PRN

## 2020-08-12 MED ORDER — BUPIVACAINE IN DEXTROSE 0.75-8.25 % IT SOLN
INTRATHECAL | Status: DC | PRN
  Administered 2020-08-12: 1.6 mL via INTRATHECAL

## 2020-08-12 SURGICAL SUPPLY — 38 items
BENZOIN TINCTURE PRP APPL 2/3 (GAUZE/BANDAGES/DRESSINGS) ×2 IMPLANT
CHLORAPREP W/TINT 26ML (MISCELLANEOUS) ×2 IMPLANT
CLAMP CORD UMBIL (MISCELLANEOUS) IMPLANT
CLIP FILSHIE TUBAL LIGA STRL (Clip) IMPLANT
CLOTH BEACON ORANGE TIMEOUT ST (SAFETY) ×2 IMPLANT
DRESSING PREVENA PLUS CUSTOM (GAUZE/BANDAGES/DRESSINGS) ×1 IMPLANT
DRSG OPSITE POSTOP 4X10 (GAUZE/BANDAGES/DRESSINGS) ×2 IMPLANT
DRSG PREVENA PLUS CUSTOM (GAUZE/BANDAGES/DRESSINGS) ×2
ELECT REM PT RETURN 9FT ADLT (ELECTROSURGICAL) ×2
ELECTRODE REM PT RTRN 9FT ADLT (ELECTROSURGICAL) ×1 IMPLANT
EXTRACTOR VACUUM M CUP 4 TUBE (SUCTIONS) IMPLANT
GLOVE BIOGEL PI IND STRL 7.0 (GLOVE) ×3 IMPLANT
GLOVE BIOGEL PI INDICATOR 7.0 (GLOVE) ×3
GLOVE ECLIPSE 7.0 STRL STRAW (GLOVE) ×2 IMPLANT
GOWN STRL REUS W/TWL LRG LVL3 (GOWN DISPOSABLE) ×4 IMPLANT
KIT ABG SYR 3ML LUER SLIP (SYRINGE) IMPLANT
NEEDLE HYPO 22GX1.5 SAFETY (NEEDLE) ×2 IMPLANT
NEEDLE HYPO 25X5/8 SAFETYGLIDE (NEEDLE) ×2 IMPLANT
NS IRRIG 1000ML POUR BTL (IV SOLUTION) ×2 IMPLANT
PACK C SECTION WH (CUSTOM PROCEDURE TRAY) ×2 IMPLANT
PAD ABD 7.5X8 STRL (GAUZE/BANDAGES/DRESSINGS) ×2 IMPLANT
PAD OB MATERNITY 4.3X12.25 (PERSONAL CARE ITEMS) ×2 IMPLANT
PENCIL SMOKE EVAC W/HOLSTER (ELECTROSURGICAL) ×2 IMPLANT
RETRACTOR TRAXI PANNICULUS (MISCELLANEOUS) ×1 IMPLANT
RTRCTR C-SECT PINK 25CM LRG (MISCELLANEOUS) IMPLANT
STRIP CLOSURE SKIN 1/4X4 (GAUZE/BANDAGES/DRESSINGS) ×2 IMPLANT
SUT PDS AB 0 CTX 36 PDP370T (SUTURE) IMPLANT
SUT PLAIN 2 0 (SUTURE) ×1
SUT PLAIN 2 0 XLH (SUTURE) IMPLANT
SUT PLAIN ABS 2-0 CT1 27XMFL (SUTURE) ×1 IMPLANT
SUT VIC AB 0 CTX 36 (SUTURE) ×2
SUT VIC AB 0 CTX36XBRD ANBCTRL (SUTURE) ×2 IMPLANT
SUT VIC AB 4-0 KS 27 (SUTURE) ×2 IMPLANT
SYR CONTROL 10ML LL (SYRINGE) ×2 IMPLANT
TOWEL OR 17X24 6PK STRL BLUE (TOWEL DISPOSABLE) ×2 IMPLANT
TRAXI PANNICULUS RETRACTOR (MISCELLANEOUS) ×1
TRAY FOLEY W/BAG SLVR 14FR LF (SET/KITS/TRAYS/PACK) ×2 IMPLANT
WATER STERILE IRR 1000ML POUR (IV SOLUTION) ×2 IMPLANT

## 2020-08-12 NOTE — Transfer of Care (Signed)
Immediate Anesthesia Transfer of Care Note  Patient: Lindsay Burnett  Procedure(s) Performed: CESAREAN SECTION, ADD LILETTA IUD  Patient Location: PACU  Anesthesia Type:Spinal  Level of Consciousness: awake  Airway & Oxygen Therapy: Patient Spontanous Breathing  Post-op Assessment: Report given to RN  Post vital signs: Reviewed and stable  Last Vitals:  Vitals Value Taken Time  BP    Temp    Pulse    Resp    SpO2      Last Pain:  Vitals:   08/12/20 0900  TempSrc: Oral         Complications: No notable events documented.

## 2020-08-12 NOTE — Discharge Summary (Signed)
Postpartum Discharge Summary     Patient Name: Lindsay Burnett DOB: Jan 29, 1990 MRN: [id number]  Date of admission: 08/12/2020 Delivery date:08/12/2020  Delivering provider: Verita Schneiders A  Date of discharge: 08/15/2020  Admitting diagnosis: History of cesarean delivery [Z98.891] Intrauterine pregnancy: [redacted]w[redacted]d    Secondary diagnosis:  Active Problems:   Type 2 diabetes mellitus ([place]   Supervision of high risk pregnancy, antepartum   Previous cesarean delivery affecting pregnancy, antepartum   Pre-existing diabetes mellitus affecting pregnancy, antepartum   Chronic hypertension with superimposed severe pre-eclampsia   S/P cesarean section   Severe pre-eclampsia, with delivery  Additional problems: None   Discharge diagnosis: Term Pregnancy Delivered, Severe Preeclampsia , and Type 2 DM                                              Post partum procedures: IUD placement Complications: None  Hospital course: Scheduled C/S   30y.o. yo [id number] admitted to the hospital 08/12/2020 for scheduled cesarean section with the following indication: Worsening CHTN with superimposed preeclampsia, T2DM, previous cesarean section .Delivery details are as follows:  Membrane Rupture Time/Date: 12:34 PM ,08/12/2020   Delivery Method:C-Section, Low Transverse  Details of operation can be found in separate operative note.  Liletta IUD was placed, also had Prevena placed over incision. On POD#2, patient noted to develop severe preeclampsia with elevated Cr to 1.5. She was treated with magnesium sulfate x 24 hours, BP controlled on Procardia.  Stable UOP.  DM coordinator helped with DM management. On POD#3, she desired discharge to home. No severe range BP, Cr remained stable. She was ambulating, tolerating a regular diet, passing flatus, and urinating well. Patient is discharged home in stable condition on  08/15/20 with plans for close outpatient follow up.         Newborn Data: Birth date:08/12/2020   Birth time:12:35 PM  Gender:Female  Living status:Living  Apgars:7 ,9  Weight:2350 g     Magnesium Sulfate received: Yes: Seizure prophylaxis BMZ received: No Rhophylac:No MMR:N/A T-DaP: declined 8/2 Flu: N/A Transfusion:No  Physical exam  Vitals:   08/15/20 0736 08/15/20 1049 08/15/20 1201 08/15/20 1403  BP: (!) 143/82 (!) 149/84 (!) 146/81 (!) 152/79  Pulse: 89 86 89 86  Resp: 15  16   Temp: 97.9 F (36.6 C)  98.1 F (36.7 C)   TempSrc: Oral  Oral   SpO2: 98%  98%    General: alert, cooperative, and no distress Lochia: appropriate Uterine Fundus: firm Incision: Healing well with no significant drainage, Dressing is clean, dry, and intact DVT Evaluation: No evidence of DVT seen on physical exam. Negative Homan's sign. Labs: Lab Results  Component Value Date   WBC 9.5 08/14/2020   HGB 10.6 (L) 08/14/2020   HCT 30.5 (L) 08/14/2020   MCV 87.1 08/14/2020   PLT 217 08/14/2020   CMP Latest Ref Rng & Units 08/15/2020 08/14/2020 08/14/2020  Glucose 70 - 99 mg/dL 112(H) 45(L) 113(H)  BUN 6 - 20 mg/dL _0 Creatinine 0.44 - 1.00 mg/dL 1.58(H) 1.51(H) 1.54(H)  Sodium 135 - 145 mmol/L 135 139 136  Potassium 3.5 - 5.1 mmol/L 3.6 3.7 3.9  Chloride 98 - 111 mmol/L 104 107 106  CO2 22 - 32 mmol/L _1 Calcium 8.9 - 10.3 mg/dL 8.0(L) 8.4(L) 8.3(L)  Total Protein 6.5 - 8.1  g/dL 5.1(L) - 4.8(L)  Total Bilirubin 0.3 - 1.2 mg/dL 0.2(L) - 0.5  Alkaline Phos 38 - 126 U/L 93 - 92  AST 15 - 41 U/L 15 - 12(L)  ALT 0 - 44 U/L 12 - 10    Edinburgh Score: Edinburgh Postnatal Depression Scale Screening Tool 08/12/2020  I have been able to laugh and see the funny side of things. 0  I have looked forward with enjoyment to things. 0  I have blamed myself unnecessarily when things went wrong. 0  I have been anxious or worried for no good reason. 0  I have felt scared or panicky for no good reason. 0  Things have been getting on top of me. 0  I have been so unhappy that I have had  difficulty sleeping. 0  I have felt sad or miserable. 0  I have been so unhappy that I have been crying. 0  The thought of harming myself has occurred to me. 0  Edinburgh Postnatal Depression Scale Total 0     After visit meds:  Allergies as of 08/15/2020       Reactions   Compazine [prochlorperazine Edisylate]    "Stroke like symptoms"   Reglan [metoclopramide]    "Stroke like symptoms"        Medication List     STOP taking these medications    aspirin EC 81 MG tablet       TAKE these medications    BD Pen Needle Nano U/F 32G X 4 MM Misc Generic drug: Insulin Pen Needle Use to administer insulin 4 times daily   Blood Pressure Kit Devi 1 Device by Does not apply route as needed.   cyclobenzaprine 10 MG tablet Commonly known as: FLEXERIL Take 0.5-1 tablets (5-10 mg total) by mouth 3 (three) times daily as needed for muscle spasms.   Dexcom G6 Sensor Misc INJECT 1 SENSOR TO THE SKIN EVERY 10 DAYS FOR CONTINUOUS GLUCOSE MONITORING.   Dexcom G6 Transmitter Misc Use as directed for continuous glucose monitoring. Reuse transmitter for 90 days then discard and replace.   docusate sodium 100 MG capsule Commonly known as: COLACE Take 1 capsule (100 mg total) by mouth 2 (two) times daily as needed for mild constipation or moderate constipation.   ferrous sulfate 325 (65 FE) MG tablet Commonly known as: FerrouSul Take 1 tablet (325 mg total) by mouth 2 (two) times daily.   GlucoCom Blood Glucose Monitor Devi Used to check Blood sugars 4 times daily. Dx. Code E11.65   insulin lispro 100 UNIT/ML KwikPen Commonly known as: HUMALOG Inject 5-8 Units into the skin 3 (three) times daily with meals.   Lantus SoloStar 100 UNIT/ML Solostar Pen Generic drug: insulin glargine Inject 10 Units into the skin at bedtime. What changed: how much to take   NIFEdipine 60 MG 24 hr tablet Commonly known as: PROCARDIA XL/NIFEDICAL XL Take 1 tablet (60 mg total) by mouth  daily. What changed:  medication strength how much to take   oxyCODONE-acetaminophen 5-325 MG tablet Commonly known as: PERCOCET/ROXICET Take 1 tablet by mouth every 4 (four) hours as needed for severe pain ((when tolerating fluids)).   PRENATAL ADULT GUMMY/DHA/FA PO Take 1 tablet by mouth daily.               Discharge Care Instructions  (From admission, onward)           Start     Ordered   08/15/20 0000  Discharge wound care:  Comments: As per discharge handout and nursing instructions   08/15/20 1512             Discharge home in stable condition Infant Feeding:  breast and bottle Infant Disposition:home with mother Discharge instruction: per After Visit Summary and Postpartum booklet. Activity: Advance as tolerated. Pelvic rest for 6 weeks.  Diet: carb modified diet Future Appointments: Future Appointments  Date Time Provider [place]  08/21/2020 10:00 AM [place] None  09/25/2020  1:10 PM Constant, Peggy, MD [place] None   Follow up Visit:  [facility] Follow up on 08/18/2020.   Specialty: Obstetrics and Gynecology Why: BP check Contact information: [address] [place] [phone]                08/15/2020 Verita Schneiders, MD

## 2020-08-12 NOTE — Anesthesia Procedure Notes (Signed)
Spinal  Patient location during procedure: OB Start time: 08/12/2020 11:49 AM End time: 08/12/2020 11:54 AM Reason for block: surgical anesthesia Staffing Performed: anesthesiologist  Anesthesiologist: Lowella Curb, MD Preanesthetic Checklist Completed: patient identified, IV checked, risks and benefits discussed, surgical consent, monitors and equipment checked, pre-op evaluation and timeout performed Spinal Block Patient position: sitting Prep: DuraPrep and site prepped and draped Patient monitoring: heart rate, cardiac monitor, continuous pulse ox and blood pressure Approach: midline Location: L3-4 Injection technique: single-shot Needle Needle type: Pencan  Needle gauge: 24 G Needle length: 10 cm Assessment Sensory level: T4 Events: CSF return

## 2020-08-12 NOTE — Anesthesia Preprocedure Evaluation (Signed)
Anesthesia Evaluation  Patient identified by MRN, date of birth, ID band Patient awake    Reviewed: Allergy & Precautions, NPO status , Patient's Chart, lab work & pertinent test results  Airway Mallampati: II  TM Distance: >3 FB Neck ROM: Full    Dental no notable dental hx.    Pulmonary neg pulmonary ROS, Current Smoker and Patient abstained from smoking.,    Pulmonary exam normal breath sounds clear to auscultation       Cardiovascular hypertension, Pt. on medications negative cardio ROS Normal cardiovascular exam Rhythm:Regular Rate:Normal     Neuro/Psych negative neurological ROS  negative psych ROS   GI/Hepatic negative GI ROS, Neg liver ROS,   Endo/Other  negative endocrine ROSdiabetes, Insulin Dependent  Renal/GU negative Renal ROS  negative genitourinary   Musculoskeletal negative musculoskeletal ROS (+)   Abdominal (+) + obese,   Peds negative pediatric ROS (+)  Hematology negative hematology ROS (+)   Anesthesia Other Findings   Reproductive/Obstetrics negative OB ROS                             Anesthesia Physical Anesthesia Plan  ASA: 3  Anesthesia Plan: Spinal   Post-op Pain Management:    Induction:   PONV Risk Score and Plan: 1 and Treatment may vary due to age or medical condition  Airway Management Planned: Natural Airway  Additional Equipment:   Intra-op Plan:   Post-operative Plan:   Informed Consent: I have reviewed the patients History and Physical, chart, labs and discussed the procedure including the risks, benefits and alternatives for the proposed anesthesia with the patient or authorized representative who has indicated his/her understanding and acceptance.     Dental advisory given  Plan Discussed with: CRNA  Anesthesia Plan Comments:         Anesthesia Quick Evaluation

## 2020-08-12 NOTE — Op Note (Signed)
[name] PROCEDURE DATE: 08/12/2020  PREOPERATIVE DIAGNOSES: Intrauterine pregnancy at [redacted]w[redacted]d weeks gestation;  chronic hypertension with superimposed preeclampsia without severe features; insulin dependent diabetes mellitus; previous cesarean section x 2; desires long term reversible contraception  POSTOPERATIVE DIAGNOSES: The same  PROCEDURE: Repeat Low Transverse Cesarean Section and Liletta Intrauterine Device Placement  SURGEON:  Dr. Jaynie Collins  ASSISTANT:  Dr. Warner Mccreedy  ANESTHESIOLOGY TEAM: CRNA: Algis Greenhouse, CRNA  INDICATIONS: Lindsay Burnett is a 30 y.o. [phone] at [redacted]w[redacted]d here for cesarean section secondary to the indications listed under preoperative diagnoses; please see preoperative note for further details.  The risks of surgery were discussed with the patient including but were not limited to: bleeding which may require transfusion or reoperation; infection which may require antibiotics; injury to bowel, bladder, ureters or other surrounding organs; injury to the fetus; need for additional procedures including hysterectomy in the event of a life-threatening hemorrhage; formation of adhesions; placental abnormalities wth subsequent pregnancies; incisional problems; thromboembolic phenomenon and other postoperative/anesthesia complications.  The patient concurred with the proposed plan, giving informed written consent for the procedure.    FINDINGS:  Viable female infant in cephalic presentation.  Apgars 7 and 9.  Clear amniotic fluid.  Intact placenta, three vessel cord.  Normal uterus, fallopian tubes and ovaries bilaterally. Liletta IUD placed. Moderate preperitoneal adhesive disease, fascia adherent to rectus muscles and peritoneum. Minimal intraperitoneal adhesive disease. Prevena was placed at the end of the procedure.   ANESTHESIA: Spinal ESTIMATED BLOOD LOSS: 287 ml SPECIMENS: Placenta sent to L&D COMPLICATIONS: None immediate  PROCEDURE IN DETAIL:  The patient  preoperatively received intravenous antibiotics and had sequential compression devices applied to her lower extremities.  She was then taken to the operating room where spinal anesthesia was administered and was found to be adequate. She was then placed in a dorsal supine position with a leftward tilt, and prepped and draped in a sterile manner.  A foley catheter was placed into her bladder and attached to constant gravity.  After an adequate timeout was performed, a Pfannenstiel skin incision was made with scalpel on her preexisting scar and carried through to the underlying layer of fascia. The fascia was incised in the midline, and this incision was extended bilaterally using the Mayo scissors.  Kocher clamps were applied to the superior aspect of the fascial incision and the underlying rectus muscles were dissected off sharply.  A similar process was carried out on the inferior aspect of the fascial incision. The rectus muscles and the peritoneum was entered sharply. The Alexis self-retaining retractor was introduced into the abdominal cavity.  Attention was turned to the lower uterine segment where a low transverse hysterotomy was made with a scalpel and extended bilaterally bluntly.  The infant was successfully delivered, the cord was clamped and cut after one minute, and the infant was handed over to the awaiting neonatology team. Uterine massage was then administered, and the placenta delivered intact with a three-vessel cord. The uterus was then cleared of clots and debris.   The Liletta IUD was placed in the fundal region, and the strings were pushed through the lower uterine segment into cervix and upper vagina.  The hysterotomy was closed with 0 Vicryl in a running locked fashion, and an imbricating layer was also placed with 0 Vicryl.  The pelvis was cleared of all clot and debris. Hemostasis was confirmed on all surfaces.  The retractor was removed.  The peritoneum and the rectus muscles were  reapproximated using 0 Vicryl  interrupted stitches. The fascia was then closed using 0 PDS in a running fashion.  The subcutaneous layer was irrigated, and the skin was closed with a 4-0 Vicryl subcuticular stitch. After the skin was closed, a Prevena disposable negative pressure wound therapy device was placed over the incision.  The suction was activated at a pressure of -125 mmHg.  The adhesive was affixed well and there were no leaks noted.  The patient tolerated the procedure well. Sponge, instrument and needle counts were correct x 3.  She was taken to the recovery room in stable condition.     Jaynie Collins, MD, FACOG Obstetrician & Gynecologist, [facility] [employer], [facility]

## 2020-08-13 ENCOUNTER — Encounter (HOSPITAL_COMMUNITY): Payer: Self-pay | Admitting: Obstetrics & Gynecology

## 2020-08-13 LAB — GLUCOSE, CAPILLARY
Glucose-Capillary: 118 mg/dL — ABNORMAL HIGH (ref 70–99)
Glucose-Capillary: 93 mg/dL (ref 70–99)
Glucose-Capillary: 74 mg/dL (ref 70–99)
Glucose-Capillary: 65 mg/dL — ABNORMAL LOW (ref 70–99)
Glucose-Capillary: 97 mg/dL (ref 70–99)

## 2020-08-13 LAB — CBC
HCT: 29.2 % — ABNORMAL LOW (ref 36.0–46.0)
Hemoglobin: 10.1 g/dL — ABNORMAL LOW (ref 12.0–15.0)
MCH: 29.3 pg (ref 26.0–34.0)
MCHC: 34.6 g/dL (ref 30.0–36.0)
MCV: 84.6 fL (ref 80.0–100.0)
Platelets: 188 10*3/uL (ref 150–400)
RBC: 3.45 MIL/uL — ABNORMAL LOW (ref 3.87–5.11)
RDW: 13.2 % (ref 11.5–15.5)
WBC: 9.7 10*3/uL (ref 4.0–10.5)
nRBC: 0 % (ref 0.0–0.2)

## 2020-08-13 LAB — COMPREHENSIVE METABOLIC PANEL
ALT: 11 U/L (ref 0–44)
AST: 15 U/L (ref 15–41)
Albumin: 1.8 g/dL — ABNORMAL LOW (ref 3.5–5.0)
Alkaline Phosphatase: 104 U/L (ref 38–126)
Anion gap: 6 (ref 5–15)
BUN: 14 mg/dL (ref 6–20)
CO2: 24 mmol/L (ref 22–32)
Calcium: 8.1 mg/dL — ABNORMAL LOW (ref 8.9–10.3)
Chloride: 106 mmol/L (ref 98–111)
Creatinine, Ser: 1.14 mg/dL — ABNORMAL HIGH (ref 0.44–1.00)
GFR, Estimated: >60 mL/min (ref >60)
Glucose, Bld: 147 mg/dL — ABNORMAL HIGH (ref 70–99)
Potassium: 4.2 mmol/L (ref 3.5–5.1)
Sodium: 136 mmol/L (ref 135–145)
Total Bilirubin: 0.5 mg/dL (ref 0.3–1.2)
Total Protein: 5 g/dL — ABNORMAL LOW (ref 6.5–8.1)

## 2020-08-13 LAB — CULTURE, BETA STREP (GROUP B ONLY): Strep Gp B Culture: NEGATIVE

## 2020-08-13 MED ORDER — IBUPROFEN 600 MG PO TABS: 600.0000 mg | ORAL_TABLET | Freq: Four times a day (QID) | ORAL | Status: DC | PRN

## 2020-08-13 MED ORDER — OXYTOCIN-SODIUM CHLORIDE 30-0.9 UT/500ML-% IV SOLN
INTRAVENOUS | Status: AC
  Filled 2020-08-13: qty 500

## 2020-08-13 MED ORDER — SODIUM CHLORIDE (PF) 0.9 % IJ SOLN
INTRAMUSCULAR | Status: AC
  Filled 2020-08-13: qty 10

## 2020-08-13 NOTE — Progress Notes (Addendum)
Post Partum Day 1   Subjective: Lindsay Burnett is doing well this morning. She reports some minimal incisional pain, but is overall well controlled with PO pain medications. She reports her bleeding is minimal. She denies HA, vision changes, RUQ/epigastric pain. She is up ad lib, voiding, tolerating PO, and + flatus  Objective: Blood pressure 116/80, pulse 81, temperature 97.6 F (36.4 C), temperature source Oral, resp. rate 18, last menstrual period 12/03/2019, SpO2 100 %, unknown if currently breastfeeding.  Physical Exam:  General: alert and no distress Lochia: appropriate  Uterine Fundus: firm Incision: healing well, no significant drainage DVT Evaluation: No evidence of DVT seen on physical exam.  Recent Labs    08/11/20 1014 08/13/20 0435  HGB 10.5* 10.1*  HCT 31.6* 29.2*    Assessment/Plan: cHTN: on procardia 30mg  XL daily, asymptomatic. BP's normotensive during PP period. Creatinine 1.14 today. Discussed with Dr. and will repeat CMP tomorrow morning. Continue to monitor Plan for discharge tomorrow     LOS: 1 day   Vergie Living, MSN, CNM 08/13/2020, 11:13 AM

## 2020-08-13 NOTE — Progress Notes (Signed)
Patients blood sugar 97 at bedtime. Informed Dr. Annia Friendly of results and she gave verbal order to hold insulin.

## 2020-08-13 NOTE — Plan of Care (Signed)

## 2020-08-13 NOTE — Anesthesia Postprocedure Evaluation (Signed)
Anesthesia Post Note  Patient: Lindsay Burnett  Procedure(s) Performed: CESAREAN SECTION, ADD LILETTA IUD     Patient location during evaluation: PACU Anesthesia Type: Spinal Level of consciousness: awake and alert Pain management: pain level controlled Vital Signs Assessment: post-procedure vital signs reviewed and stable Respiratory status: spontaneous breathing, nonlabored ventilation and respiratory function stable Cardiovascular status: blood pressure returned to baseline and stable Postop Assessment: no apparent nausea or vomiting Anesthetic complications: no   No notable events documented.  Last Vitals:  Vitals:   08/13/20 1316 08/13/20 1448  BP:  124/72  Pulse:  84  Resp: 17 17  Temp:  36.9 C  SpO2:  99%    Last Pain:  Vitals:   08/13/20 1448  TempSrc: Oral  PainSc:    Pain Goal: Patients Stated Pain Goal: 0 (08/13/20 1422)                 Lowella Curb

## 2020-08-14 LAB — COMPREHENSIVE METABOLIC PANEL
ALT: 10 U/L (ref 0–44)
AST: 12 U/L — ABNORMAL LOW (ref 15–41)
Albumin: 1.7 g/dL — ABNORMAL LOW (ref 3.5–5.0)
Alkaline Phosphatase: 92 U/L (ref 38–126)
Anion gap: 7 (ref 5–15)
BUN: 19 mg/dL (ref 6–20)
CO2: 23 mmol/L (ref 22–32)
Calcium: 8.3 mg/dL — ABNORMAL LOW (ref 8.9–10.3)
Chloride: 106 mmol/L (ref 98–111)
Creatinine, Ser: 1.54 mg/dL — ABNORMAL HIGH (ref 0.44–1.00)
GFR, Estimated: 46 mL/min — ABNORMAL LOW (ref >60)
Glucose, Bld: 113 mg/dL — ABNORMAL HIGH (ref 70–99)
Potassium: 3.9 mmol/L (ref 3.5–5.1)
Sodium: 136 mmol/L (ref 135–145)
Total Bilirubin: 0.5 mg/dL (ref 0.3–1.2)
Total Protein: 4.8 g/dL — ABNORMAL LOW (ref 6.5–8.1)

## 2020-08-14 LAB — GLUCOSE, CAPILLARY
Glucose-Capillary: 118 mg/dL — ABNORMAL HIGH (ref 70–99)
Glucose-Capillary: 52 mg/dL — ABNORMAL LOW (ref 70–99)
Glucose-Capillary: 81 mg/dL (ref 70–99)
Glucose-Capillary: 124 mg/dL — ABNORMAL HIGH (ref 70–99)

## 2020-08-14 LAB — CBC
HCT: 30.5 % — ABNORMAL LOW (ref 36.0–46.0)
Hemoglobin: 10.6 g/dL — ABNORMAL LOW (ref 12.0–15.0)
MCH: 30.3 pg (ref 26.0–34.0)
MCHC: 34.8 g/dL (ref 30.0–36.0)
MCV: 87.1 fL (ref 80.0–100.0)
Platelets: 217 10*3/uL (ref 150–400)
RBC: 3.5 MIL/uL — ABNORMAL LOW (ref 3.87–5.11)
RDW: 13.4 % (ref 11.5–15.5)
WBC: 9.5 10*3/uL (ref 4.0–10.5)
nRBC: 0 % (ref 0.0–0.2)

## 2020-08-14 LAB — MAGNESIUM: Magnesium: 2.7 mg/dL — ABNORMAL HIGH (ref 1.7–2.4)

## 2020-08-14 LAB — BASIC METABOLIC PANEL
Anion gap: 7 (ref 5–15)
BUN: 19 mg/dL (ref 6–20)
CO2: 25 mmol/L (ref 22–32)
Calcium: 8.4 mg/dL — ABNORMAL LOW (ref 8.9–10.3)
Chloride: 107 mmol/L (ref 98–111)
Creatinine, Ser: 1.51 mg/dL — ABNORMAL HIGH (ref 0.44–1.00)
GFR, Estimated: 47 mL/min — ABNORMAL LOW (ref >60)
Glucose, Bld: 45 mg/dL — ABNORMAL LOW (ref 70–99)
Potassium: 3.7 mmol/L (ref 3.5–5.1)
Sodium: 139 mmol/L (ref 135–145)

## 2020-08-14 MED ORDER — LACTATED RINGERS IV SOLN: INTRAVENOUS | Status: AC

## 2020-08-14 MED ORDER — MAGNESIUM SULFATE 40 GM/1000ML IV SOLN
1.0000 g/h | INTRAVENOUS | Status: DC
  Administered 2020-08-14: 1 g/h via INTRAVENOUS
  Filled 2020-08-14 (×3): qty 1000

## 2020-08-14 NOTE — Progress Notes (Signed)
Post Operative Day 2 Subjective: up ad lib, voiding, tolerating PO, and + flatus. Pain is 9/10, but pt thinks that getting up and moving will help. She has a headache this morning, but denies vision changes, shortness of breath, or chest pain. She says that she plans to feed with formula.  Objective: Blood pressure 127/79, pulse 88, temperature 98.3 F (36.8 C), temperature source Oral, resp. rate 16, last menstrual period 12/03/2019, SpO2 100 %, unknown if currently breastfeeding.  Physical Exam:  General: alert, cooperative, and no distress Lochia: appropriate Uterine Fundus: firm Incision: healing well, no significant drainage DVT Evaluation: No evidence of DVT seen on physical exam. No significant calf/ankle edema.  Recent Labs    08/11/20 1014 08/13/20 0435  HGB 10.5* 10.1*  HCT 31.6* 29.2*   Creatinine 1.54  Assessment/Plan: Circumcision prior to discharge  cHTN with SIPE w/SF - start magnesium sulfate - CBC - BMP - strict I/Os - d/c ibuprofen - transfer to [facility] specialty care    LOS: 2 days   Gwyndolyn Kaufman 08/14/2020, 9:00 AM

## 2020-08-15 ENCOUNTER — Other Ambulatory Visit (HOSPITAL_COMMUNITY): Payer: Self-pay

## 2020-08-15 DIAGNOSIS — O1414 Severe pre-eclampsia complicating childbirth: Secondary | ICD-10-CM | POA: Diagnosis not present

## 2020-08-15 LAB — GLUCOSE, CAPILLARY
Glucose-Capillary: 126 mg/dL — ABNORMAL HIGH (ref 70–99)
Glucose-Capillary: 108 mg/dL — ABNORMAL HIGH (ref 70–99)

## 2020-08-15 LAB — COMPREHENSIVE METABOLIC PANEL
ALT: 12 U/L (ref 0–44)
AST: 15 U/L (ref 15–41)
Albumin: 1.8 g/dL — ABNORMAL LOW (ref 3.5–5.0)
Alkaline Phosphatase: 93 U/L (ref 38–126)
Anion gap: 9 (ref 5–15)
BUN: 20 mg/dL (ref 6–20)
CO2: 22 mmol/L (ref 22–32)
Calcium: 8 mg/dL — ABNORMAL LOW (ref 8.9–10.3)
Chloride: 104 mmol/L (ref 98–111)
Creatinine, Ser: 1.58 mg/dL — ABNORMAL HIGH (ref 0.44–1.00)
GFR, Estimated: 44 mL/min — ABNORMAL LOW (ref >60)
Glucose, Bld: 112 mg/dL — ABNORMAL HIGH (ref 70–99)
Potassium: 3.6 mmol/L (ref 3.5–5.1)
Sodium: 135 mmol/L (ref 135–145)
Total Bilirubin: 0.2 mg/dL — ABNORMAL LOW (ref 0.3–1.2)
Total Protein: 5.1 g/dL — ABNORMAL LOW (ref 6.5–8.1)

## 2020-08-15 MED ORDER — CYCLOBENZAPRINE HCL 10 MG PO TABS
5.0000 mg | ORAL_TABLET | Freq: Three times a day (TID) | ORAL | 0 refills | Status: DC | PRN
  Filled 2020-08-15: qty 30, 10d supply, fill #0

## 2020-08-15 MED ORDER — NIFEDIPINE ER 60 MG PO TB24
60.0000 mg | ORAL_TABLET | Freq: Every day | ORAL | 0 refills | Status: DC
  Filled 2020-08-15: qty 30, 30d supply, fill #0

## 2020-08-15 MED ORDER — NIFEDIPINE ER OSMOTIC RELEASE 30 MG PO TB24
30.0000 mg | ORAL_TABLET | Freq: Once | ORAL | Status: AC
  Administered 2020-08-15: 30 mg via ORAL
  Filled 2020-08-15: qty 1

## 2020-08-15 MED ORDER — OXYCODONE-ACETAMINOPHEN 5-325 MG PO TABS
1.0000 | ORAL_TABLET | ORAL | 0 refills | Status: DC | PRN
  Filled 2020-08-15: qty 30, 5d supply, fill #0

## 2020-08-15 MED ORDER — LANTUS SOLOSTAR 100 UNIT/ML ~~LOC~~ SOPN
10.0000 [IU] | PEN_INJECTOR | Freq: Every day | SUBCUTANEOUS | 11 refills | Status: DC
Start: 2020-08-15 — End: 2022-04-23
  Filled 2020-08-15: qty 3, 30d supply, fill #0
  Filled 2020-08-15 – 2020-12-25 (×2): qty 3, 28d supply, fill #0

## 2020-08-15 MED ORDER — DOCUSATE SODIUM 100 MG PO CAPS
100.0000 mg | ORAL_CAPSULE | Freq: Two times a day (BID) | ORAL | 2 refills | Status: DC | PRN
  Filled 2020-08-15: qty 30, 15d supply, fill #0

## 2020-08-15 NOTE — Progress Notes (Signed)
Provena   conveted to home canister and teaching sheet provided to pt

## 2020-08-15 NOTE — Progress Notes (Signed)
Teaching complete pt out with family

## 2020-08-15 NOTE — Progress Notes (Signed)
Inpatient Diabetes Program Recommendations  AACE/ADA: New Consensus Statement on Inpatient Glycemic Control (2015)  Target Ranges:  Prepandial:   less than 140 mg/dL      Peak postprandial:   less than 180 mg/dL (1-2 hours)      Critically ill patients:  140 - 180 mg/dL   Lab Results  Component Value Date   GLUCAP 126 (H) 08/15/2020   HGBA1C 6.2 (H) 03/03/2020    Review of Glycemic Control Results for MAGGY, WYBLE (MRN [id number]) as of 08/15/2020 08:28  Ref. Range 08/15/2020 05:12  Glucose-Capillary Latest Ref Range: 70 - 99 mg/dL 166 (H)   Diabetes history: Type 2 Dm Outpatient Diabetes medications: Humalog 5-8 units TID, Lantus 20 units QHS Current orders for Inpatient glycemic control: Semglee 10 units QHS, Novolog 0-15 units TID & HS  Inpatient Diabetes Program Recommendations:    Noted hypoglycemic episode yesterday. No insulin administered prior to event. No nursing note. Assuming related to poor intake? Following.   Thanks, Lujean Rave, MSN, RNC-OB Diabetes Coordinator [phone] (8a-5p)

## 2020-08-15 NOTE — Progress Notes (Signed)
Postpartum Day 3: Cesarean Delivery at [redacted]w[redacted]d for [facility] with SI PEC, now with severe features.  Subjective: Off Magnesium x 24 h.  Patient denies any headaches, visual symptoms, RUQ/epigastric pain or other concerning symptoms. Patient reports tolerating PO, + flatus, and no problems voiding.  Bbay is stable at bedside.   Objective: Vital signs in last 24 hours: Temp:  [97.8 F (36.6 C)-98.4 F (36.9 C)] 97.9 F (36.6 C) (08/09 0736) Pulse Rate:  [85-103] 86 (08/09 1049) Resp:  [14-18] 15 (08/09 0736) BP: (92-149)/(61-87) 149/84 (08/09 1049) SpO2:  [98 %-100 %] 98 % (08/09 0736)  Patient Vitals for the past 24 hrs:  BP Temp Temp src Pulse Resp SpO2  08/15/20 1049 (!) 149/84 -- -- 86 -- --  08/15/20 0736 (!) 143/82 97.9 F (36.6 C) Oral 89 15 98 %  08/15/20 0600 -- -- -- -- 14 --  08/15/20 0435 -- -- -- -- -- 99 %  08/15/20 0434 137/84 98.1 F (36.7 C) Oral 93 16 100 %  08/15/20 0115 136/78 97.8 F (36.6 C) Oral 86 18 100 %  08/14/20 2159 134/76 98.3 F (36.8 C) Oral 89 16 100 %  08/14/20 1951 112/85 98.4 F (36.9 C) Oral (!) 103 18 100 %  08/14/20 1901 (!) 145/87 -- -- 98 -- --  08/14/20 1800 133/84 -- -- 97 16 --  08/14/20 1700 113/61 -- -- 98 18 --  08/14/20 1600 130/68 -- -- 92 16 --  08/14/20 1501 135/70 -- -- 90 16 --  08/14/20 1401 (!) 146/82 -- -- 85 16 --  08/14/20 1300 108/66 -- -- 88 16 --  08/14/20 1200 92/76 -- -- 92 -- --  08/14/20 1116 131/72 -- -- 90 -- --    Physical Exam:  General: alert and no distress Lochia: appropriate Uterine Fundus: firm Incision: Prevena in place DVT Evaluation: No evidence of DVT seen on physical exam. Negative Homan's sign.  Recent Labs    08/13/20 0435 08/14/20 1528  HGB 10.1* 10.6*  HCT 29.2* 30.5*    Assessment/Plan: Status post Cesarean section. Doing well postoperatively.  Increased Procardia XL to 60 daily, continue to monitor If remains stable can discharge to home alter today, already has follow up  appointments scheduled at [facility] Routine postpartum care.  Jaynie Collins, MD 08/15/2020, 10:58 AM

## 2020-08-18 ENCOUNTER — Ambulatory Visit: Payer: Medicaid Other

## 2020-08-21 ENCOUNTER — Ambulatory Visit (INDEPENDENT_AMBULATORY_CARE_PROVIDER_SITE_OTHER): Payer: Medicaid Other

## 2020-08-21 ENCOUNTER — Other Ambulatory Visit: Payer: Self-pay

## 2020-08-21 VITALS — BP 155/102 | HR 72

## 2020-08-21 DIAGNOSIS — Z5189 Encounter for other specified aftercare: Secondary | ICD-10-CM

## 2020-08-21 NOTE — Progress Notes (Signed)
Subjective:     Lindsay Burnett is a 30 y.o. female who presents to the clinic 5 weeks status post cesarean . Eating a regular diet without difficulty. Bowel movements are normal. Pain is well controlled.  The following portions of the patient's history were reviewed and updated as appropriate:   Review of Systems    Objective:    BP (!) 140/92   Pulse 72  General:  alert  Abdomen: soft, bowel sounds active, non-tender  Incision:   healing well, no drainage, no erythema, no hernia, no seroma, no swelling, no dehiscence, incision well approximated     Assessment:    Doing well postoperatively. Wound vac was removed   Plan:    1. Continue any current medications. 2. Wound care discussed. 3. Activity restrictions: no bending, stooping, or squatting 4. Anticipated return to work: not applicable. 5. Follow up: Postpartum  Updated:4/:56 pm   B/P 112/74 after taking her medication. Patient was advised to continued medication as instructed.  Lindsay Burnett Emeline Darling, CMA

## 2020-08-24 ENCOUNTER — Telehealth (HOSPITAL_COMMUNITY): Payer: Self-pay | Admitting: *Deleted

## 2020-08-24 NOTE — Telephone Encounter (Signed)
Left message to return nurse call.  Duffy Rhody, RN 08-24-2020 at 3:04pm

## 2020-09-25 ENCOUNTER — Ambulatory Visit: Payer: Medicaid Other | Admitting: Obstetrics and Gynecology

## 2020-09-26 ENCOUNTER — Ambulatory Visit: Payer: Medicaid Other | Admitting: Obstetrics & Gynecology

## 2020-10-04 ENCOUNTER — Telehealth: Payer: Self-pay | Admitting: *Deleted

## 2020-10-04 NOTE — Telephone Encounter (Signed)
Returned TC to patient. Patient concerned that IUD may have come out. Reports feeling strings 10/03/20 and they felt long. States unable to feel strings today. Advised patient to make an appt for IUD check. Call transferred to front office for scheduling per pt request.

## 2020-10-11 ENCOUNTER — Other Ambulatory Visit: Payer: Self-pay

## 2020-10-11 ENCOUNTER — Ambulatory Visit (INDEPENDENT_AMBULATORY_CARE_PROVIDER_SITE_OTHER): Payer: Medicaid Other | Admitting: Obstetrics & Gynecology

## 2020-10-11 VITALS — BP 135/91 | HR 86 | Ht 68.0 in | Wt 263.1 lb

## 2020-10-11 DIAGNOSIS — T8332XA Displacement of intrauterine contraceptive device, initial encounter: Secondary | ICD-10-CM | POA: Diagnosis not present

## 2020-10-11 DIAGNOSIS — Z30011 Encounter for initial prescription of contraceptive pills: Secondary | ICD-10-CM

## 2020-10-11 DIAGNOSIS — Z98891 History of uterine scar from previous surgery: Secondary | ICD-10-CM

## 2020-10-11 MED ORDER — NORGESTIMATE-ETH ESTRADIOL 0.25-35 MG-MCG PO TABS: 1.0000 | ORAL_TABLET | Freq: Every day | ORAL | 11 refills | Status: DC

## 2020-10-11 NOTE — Progress Notes (Signed)
Concerned that IUD has come out. Strings felt long one day, then the next could not feel strings at all. Did not take BP med this am because she had to drive a long distance and it makes her dizzy in the first hour. Will take when returns home.

## 2020-10-11 NOTE — Progress Notes (Signed)
Patient ID: Lindsay Burnett, female   DOB: 06/29/1990, 30 y.o.   MRN: [id number]  Chief Complaint  Patient presents with   Contraception    HPI Lindsay Burnett is a 30 y.o. female.  She is 2 month postop cesarean and IUD placement, concerned that the IUD fell out. The strings were long and she now can't feel them HPI  Past Medical History:  Diagnosis Date   Blood transfusion without reported diagnosis    Diabetes mellitus without complication ([place])    Pregnancy induced hypertension     Past Surgical History:  Procedure Laterality Date   CESAREAN SECTION     2 cesarean sections   CESAREAN SECTION N/A 08/12/2020   Procedure: CESAREAN SECTION, ADD LILETTA IUD;  Surgeon: Osborne Oman, MD;  Location: [facility] LD ORS;  Service: Obstetrics;  Laterality: N/A;    Family History  Problem Relation Age of Onset   Diabetes Mother    Diabetes Maternal Grandmother     Social History Social History   Tobacco Use   Smoking status: Every Day    Packs/day: 0.50    Years: 4.00    Pack years: 2.00    Types: Cigarettes   Smokeless tobacco: Never  Vaping Use   Vaping Use: Never used  Substance Use Topics   Alcohol use: Not Currently    Comment: occ   Drug use: Never    Allergies  Allergen Reactions   Compazine [Prochlorperazine Edisylate]     "Stroke like symptoms"   Reglan [Metoclopramide]     "Stroke like symptoms"    Current Outpatient Medications  Medication Sig Dispense Refill   insulin glargine (LANTUS SOLOSTAR) 100 UNIT/ML Solostar Pen Inject 10 Units into the skin at bedtime. 15 mL 11   insulin lispro (HUMALOG) 100 UNIT/ML KwikPen Inject 5-8 Units into the skin 3 (three) times daily with meals.     Insulin Pen Needle (BD PEN NEEDLE NANO U/F) 32G X 4 MM MISC Use to administer insulin 4 times daily     NIFEdipine (ADALAT CC) 60 MG 24 hr tablet Take 1 tablet (60 mg total) by mouth daily. 30 tablet 0   norgestimate-ethinyl estradiol (ORTHO-CYCLEN) 0.25-35 MG-MCG tablet Take 1  tablet by mouth daily. 28 tablet 11   Blood Glucose Monitoring Suppl (GLUCOCOM BLOOD GLUCOSE MONITOR) DEVI Used to check Blood sugars 4 times daily. Dx. Code E11.65     Blood Pressure Monitoring (BLOOD PRESSURE KIT) DEVI 1 Device by Does not apply route as needed. 1 each 0   Continuous Blood Gluc Sensor (DEXCOM G6 SENSOR) MISC INJECT 1 SENSOR TO THE SKIN EVERY 10 DAYS FOR CONTINUOUS GLUCOSE MONITORING.     Continuous Blood Gluc Transmit (DEXCOM G6 TRANSMITTER) MISC Use as directed for continuous glucose monitoring. Reuse transmitter for 90 days then discard and replace.     cyclobenzaprine (FLEXERIL) 10 MG tablet Take 0.5-1 tablets (5-10 mg total) by mouth 3 (three) times daily as needed for muscle spasms. (Patient not taking: Reported on 10/11/2020) 30 tablet 0   docusate sodium (COLACE) 100 MG capsule Take 1 capsule (100 mg total) by mouth 2 (two) times daily as needed for mild constipation or moderate constipation. (Patient not taking: Reported on 10/11/2020) 30 capsule 2   ferrous sulfate (FERROUSUL) 325 (65 FE) MG tablet Take 1 tablet (325 mg total) by mouth 2 (two) times daily. (Patient not taking: Reported on 10/11/2020) 60 tablet 1   oxyCODONE-acetaminophen (PERCOCET/ROXICET) 5-325 MG tablet Take 1 tablet by mouth every 4 (four)  hours as needed for severe pain ((when tolerating fluids)). (Patient not taking: Reported on 10/11/2020) 30 tablet 0   Prenatal MV & Min w/FA-DHA (PRENATAL ADULT GUMMY/DHA/FA PO) Take 1 tablet by mouth daily. (Patient not taking: Reported on 10/11/2020)     No current facility-administered medications for this visit.    Review of Systems Review of Systems  Constitutional: Negative.   Respiratory: Negative.    Genitourinary:  Negative for pelvic pain, vaginal bleeding and vaginal discharge.   Blood pressure (!) 135/91, pulse 86, height 5' 8" (1.727 m), weight 263 lb 1.6 oz (119.3 kg), last menstrual period 09/17/2020, unknown if currently breastfeeding.  Physical  Exam Physical Exam Vitals and nursing note reviewed. Exam conducted with a chaperone present.  Constitutional:      Appearance: Normal appearance.  Genitourinary:    General: Normal vulva.     Exam position: Lithotomy position.     Vagina: Normal.     Cervix: No discharge (string not seen, device not palpable).     Uterus: Normal.   Skin:    General: Skin is warm and dry.  Neurological:     Mental Status: She is alert.  Psychiatric:        Behavior: Behavior normal.    Data Reviewed Op note  Assessment S/P cesarean section  Postpartum care following cesarean delivery  Oral contraception initial prescription - Plan: norgestimate-ethinyl estradiol (ORTHO-CYCLEN) 0.25-35 MG-MCG tablet IUD expulsion  Plan Meds ordered this encounter  Medications   norgestimate-ethinyl estradiol (ORTHO-CYCLEN) 0.25-35 MG-MCG tablet    Sig: Take 1 tablet by mouth daily.    Dispense:  28 tablet    Refill:  [place] 10/11/2020, 4:33 PM

## 2020-11-17 ENCOUNTER — Other Ambulatory Visit: Payer: Self-pay | Admitting: Obstetrics & Gynecology

## 2020-11-21 ENCOUNTER — Other Ambulatory Visit: Payer: Self-pay

## 2020-12-25 ENCOUNTER — Other Ambulatory Visit: Payer: Self-pay

## 2022-03-07 IMAGING — US US MFM OB FOLLOW-UP
1 series · 14 of 28 positions shown · non-contrast
Comparison: none

[Series 1: us mfm ob follow-up · 32 acquisitions, 14 frames shown]
[im 2/32]
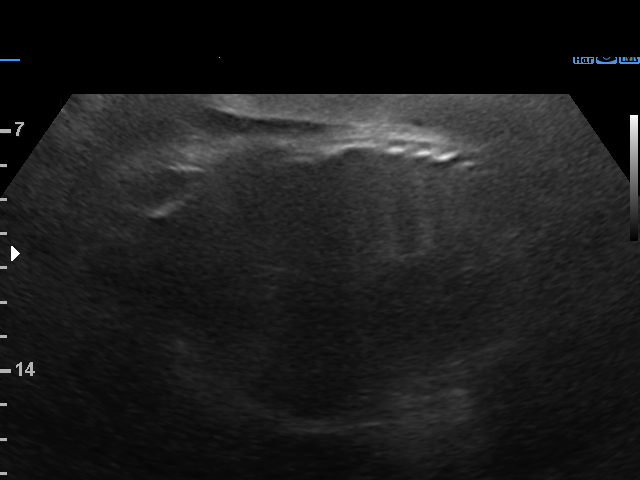
[im 4/32]
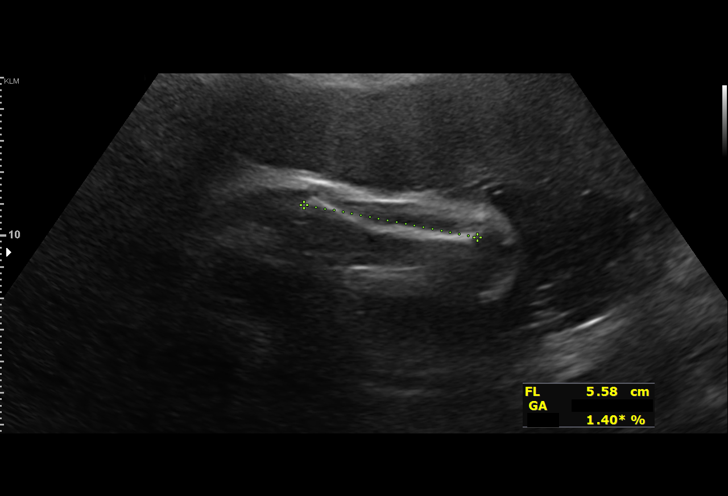
[im 6/32]
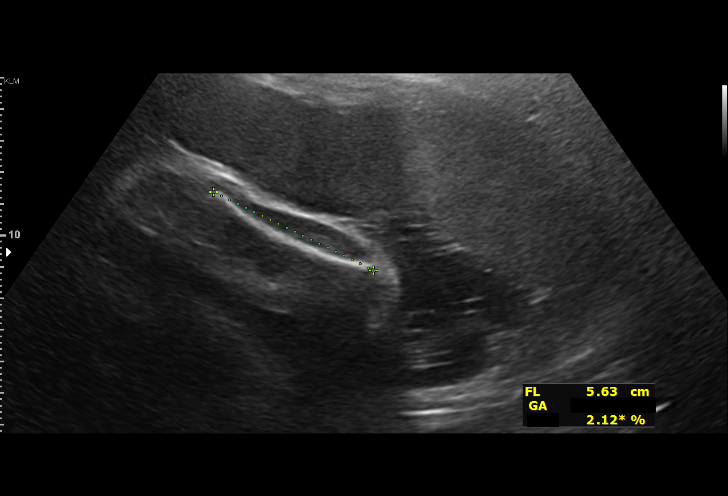
[im 9/32]
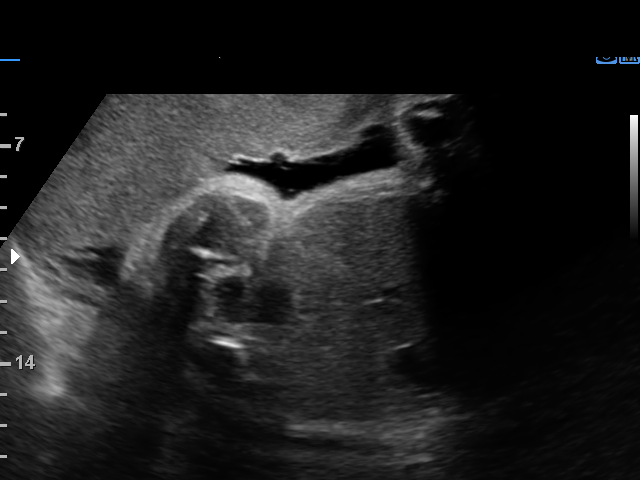
[im 11/32]
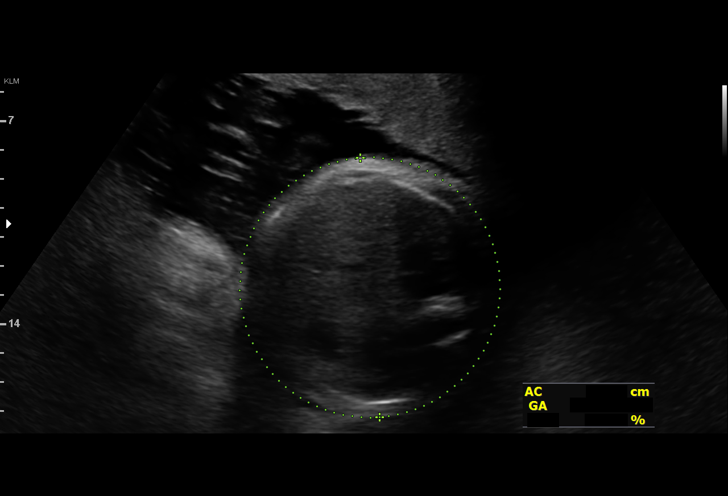
[im 13/32]
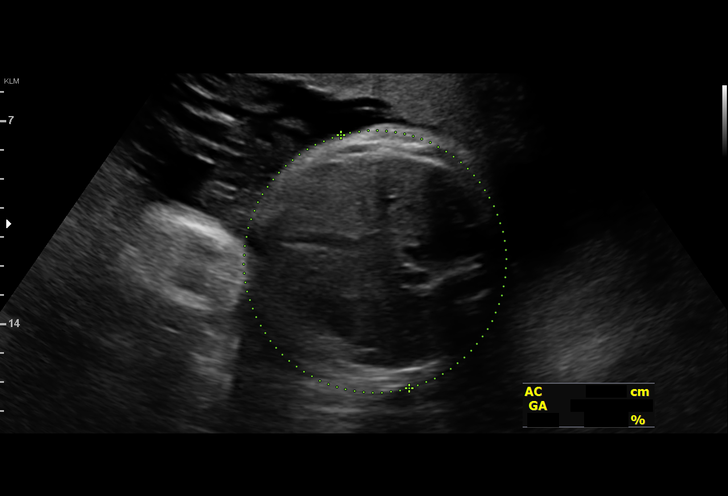
[im 15/32]
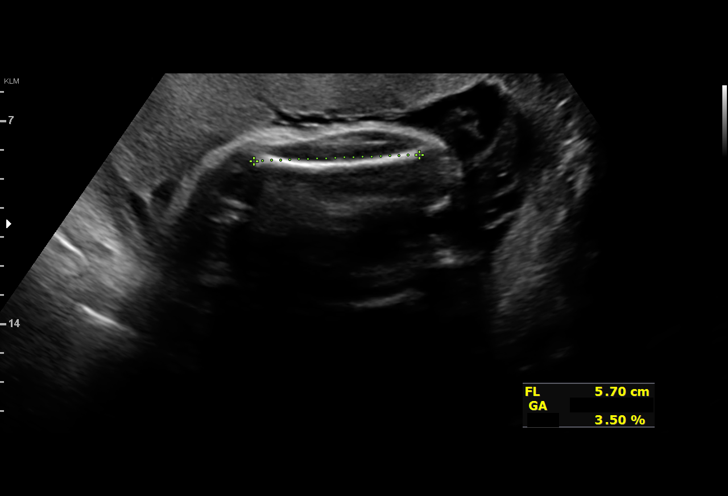
[im 18/32]
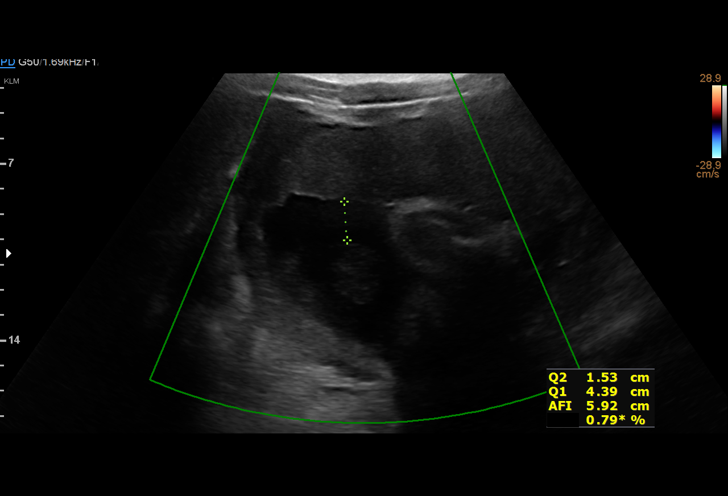
[im 20/32]
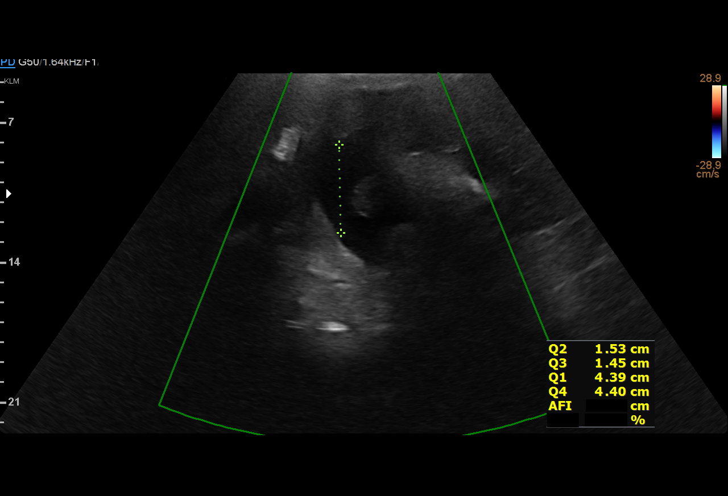
[im 22/32]
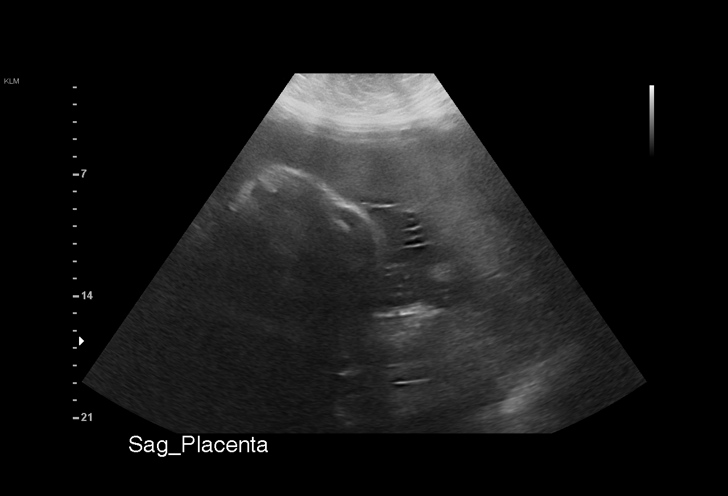
[im 25/32]
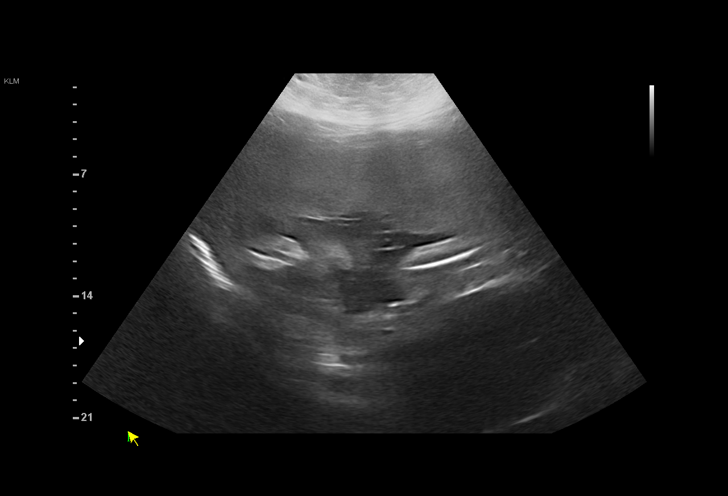
[im 27/32]
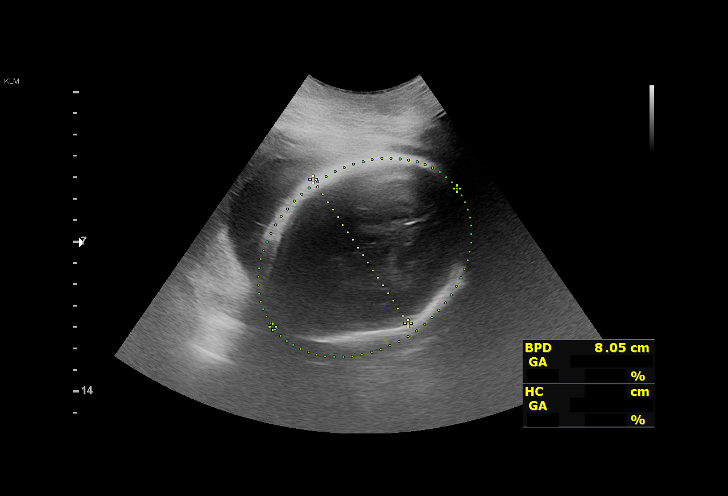
[im 29/32]
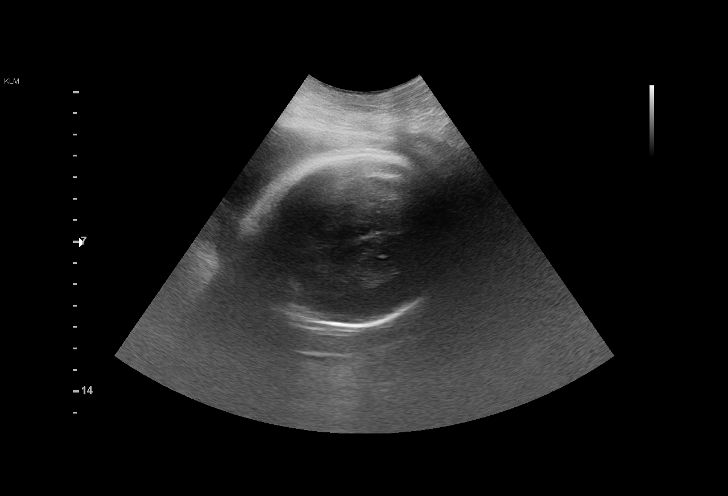
[im 32/32]
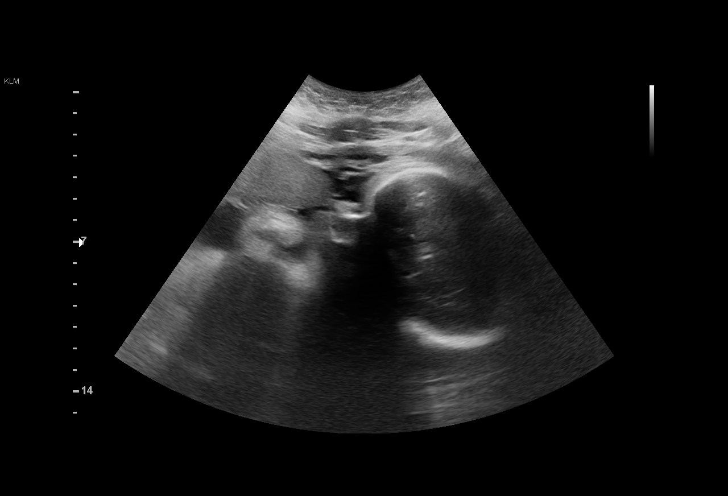

[14 of 28 positions shown; findings below may reference images not displayed]

Indications

 Pre-existing diabetes, type 2, in pregnancy,
 third trimester (insulin)
 31 weeks gestation of pregnancy
 Poor obstetric history: Previous preterm
 delivery, antepartum x 2 (both c/s due to pre
 e)
 Previous cesarean delivery, antepartum x 2
 Low risk NIPS/neg AFP
Fetal Evaluation

 Num Of Fetuses:         1
 Fetal Heart Rate(bpm):  155
 Cardiac Activity:       Observed
 Presentation:           Cephalic
 Placenta:               Anterior
 P. Cord Insertion:      Previously Visualized

 Amniotic Fluid
 AFI FV:      Within normal limits

 AFI Sum(cm)     %Tile       Largest Pocket(cm)
 11.77           29

 RUQ(cm)       RLQ(cm)       LUQ(cm)        LLQ(cm)

Biophysical Evaluation
 Amniotic F.V:   Within normal limits       F. Tone:        Observed
 F. Movement:    Observed                   Score:          [DATE]
 F. Breathing:   Observed
Biometry

 BPD:      80.1  mm     G. Age:  32w 1d         50  %    CI:        70.67   %    70 - 86
                                                         FL/HC:      18.9   %    19.1 -
 HC:      303.7  mm     G. Age:  33w 5d         65  %    HC/AC:      1.07        0.96 -
 AC:      283.7  mm     G. Age:  32w 3d         65  %    FL/BPD:     71.7   %    71 - 87
 FL:       57.4  mm     G. Age:  30w 1d        4.7  %    FL/AC:      20.2   %    20 - 24

 Est. FW:    9955  gm      4 lb 1 oz     37  %
OB History

 Gravidity:    7         Term:   1        Prem:   2        SAB:   3
 TOP:          0       Ectopic:  0        Living: 3
Gestational Age

 LMP:           31w 6d        Date:  12/03/19                 EDD:   09/08/20
 U/S Today:     32w 1d                                        EDD:   09/06/20
 Best:          31w 6d     Det. By:  LMP  (12/03/19)          EDD:   09/08/20
Anatomy

 Cranium:               Appears normal         LVOT:                   Not well visualized
 Cavum:                 Previously seen        Aortic Arch:            Previously seen
 Ventricles:            Previously seen        Ductal Arch:            Not well visualized
 Choroid Plexus:        Previously seen        Diaphragm:              Previously seen
 Cerebellum:            Previously seen        Stomach:                Appears normal, left
                                                                       sided
 Posterior Fossa:       Previously seen        Abdomen:                Appears normal
 Nuchal Fold:           Not applicable (>20    Abdominal Wall:         Previously seen
                        wks GA)
 Face:                  Orbits and profile     Cord Vessels:           Previously seen
                        previously seen
 Lips:                  Previously seen        Kidneys:                Previously seen
 Palate:                Not well visualized    Bladder:                Appears normal
 Thoracic:              Previously seen        Spine:                  Previously seen
 Heart:                 Not well visualized    Upper Extremities:      Previously seen
 RVOT:                  Previously seen        Lower Extremities:      Previously seen

 Other:  Open hands/5th digits and feet/RIGHT heel visualized previously
         visualized. Lenses and nasal bone prev. visualized. Fetus appears to
         be a male. Technically difficult due to maternal habitus.
Cervix Uterus Adnexa

 Cervix
 Not visualized (advanced GA >92wks)

 Adnexa
 No abnormality visualized.
Impression

 Fetal growth is appropriate for gestational age .Amniotic fluid
 is normal and good fetal activity is seen .Antenatal testing is
 reassuring. BPP [DATE].
Recommendations

 -Continue weekly BPP till delivery.
                 Fortier, Gayatri

## 2022-03-22 IMAGING — US US MFM FETAL BPP W/O NON-STRESS
1 series · 13 of 22 positions shown · non-contrast
Comparison: none

[Series 1: us mfm fetal bpp w/o non-stress · 22 acquisitions, 13 frames shown]
[im 1/22]
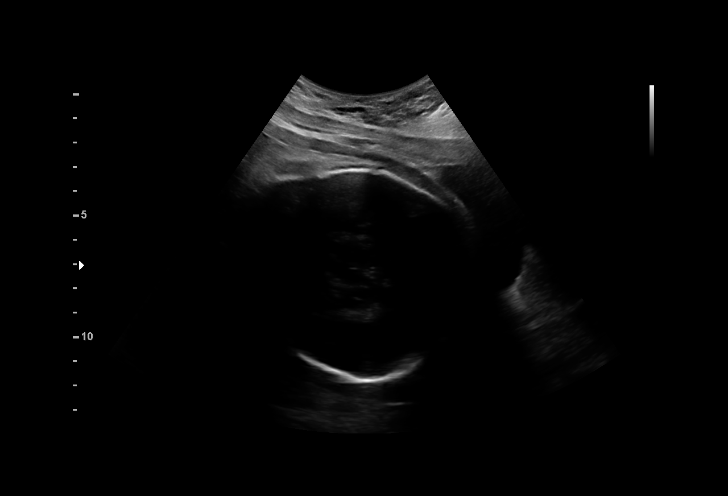
[im 3/22]
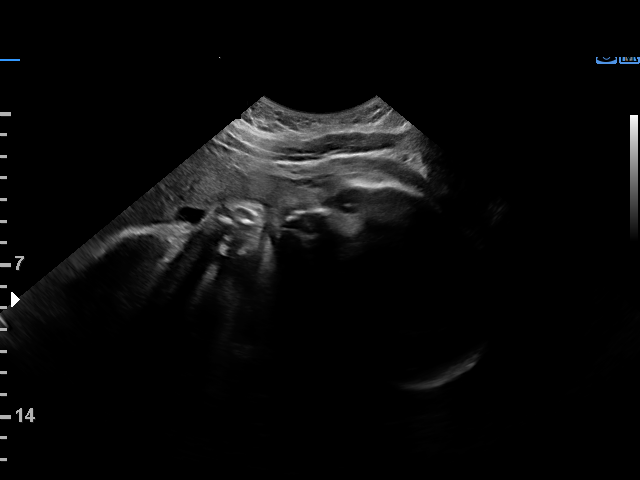
[im 5/22]
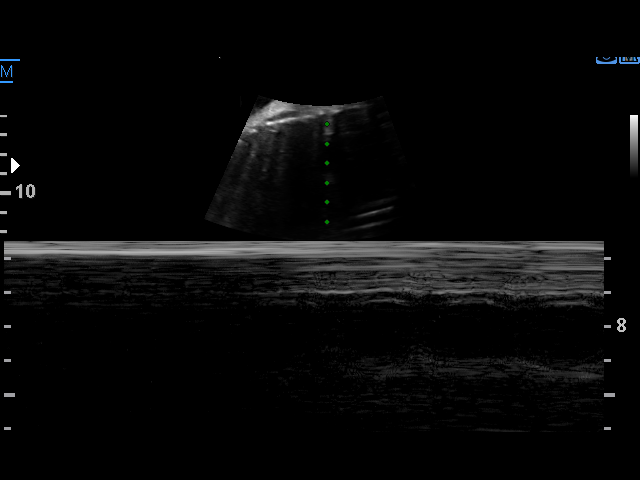
[im 6/22]
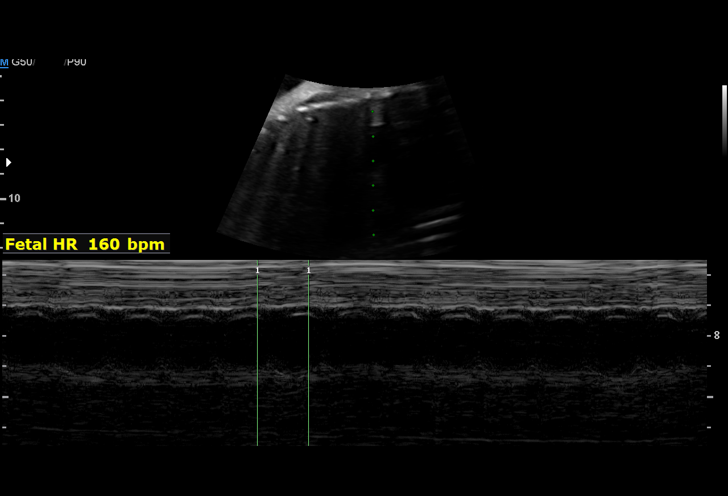
[im 8/22]
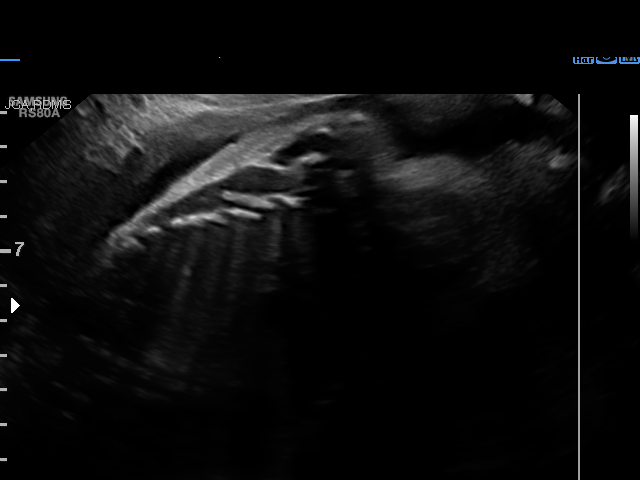
[im 10/22]
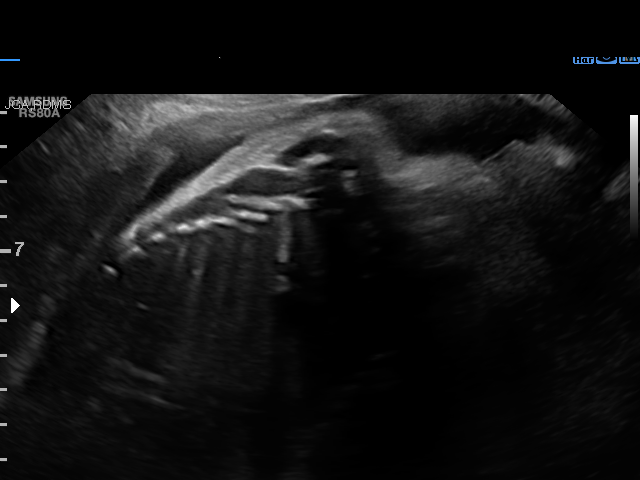
[im 12/22]
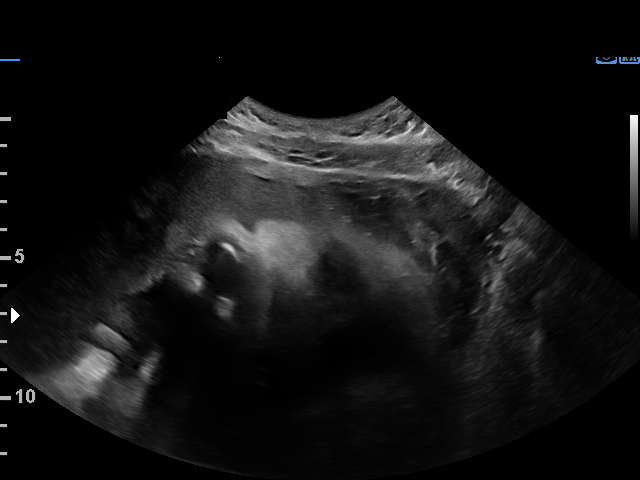
[im 13/22]
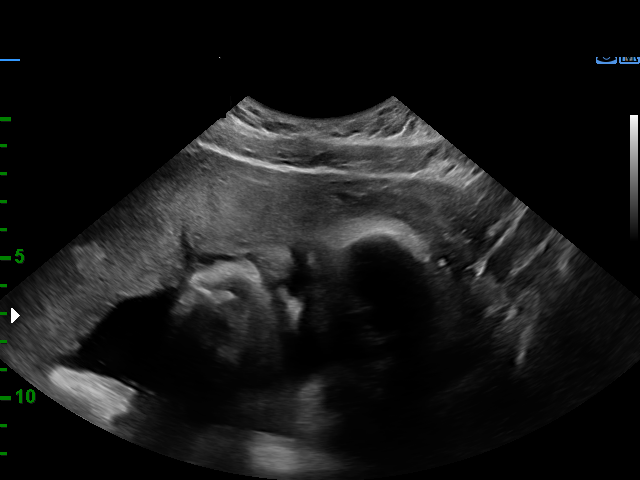
[im 15/22]
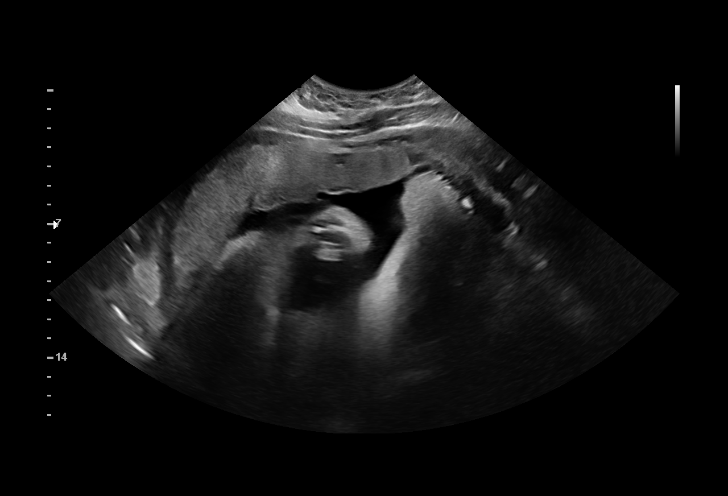
[im 17/22]
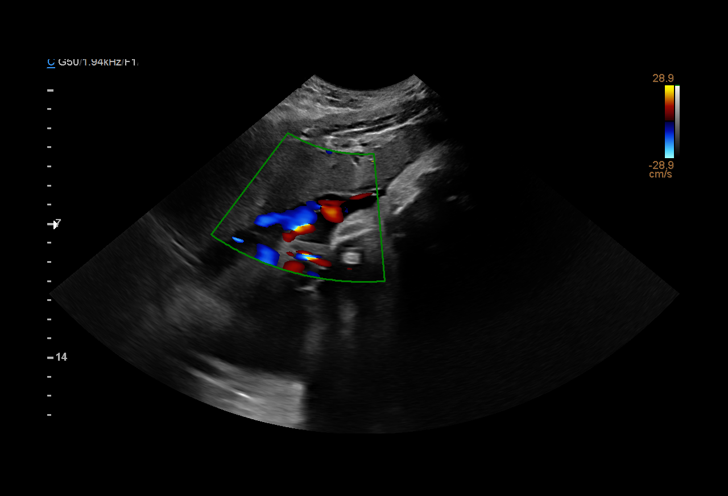
[im 18/22]
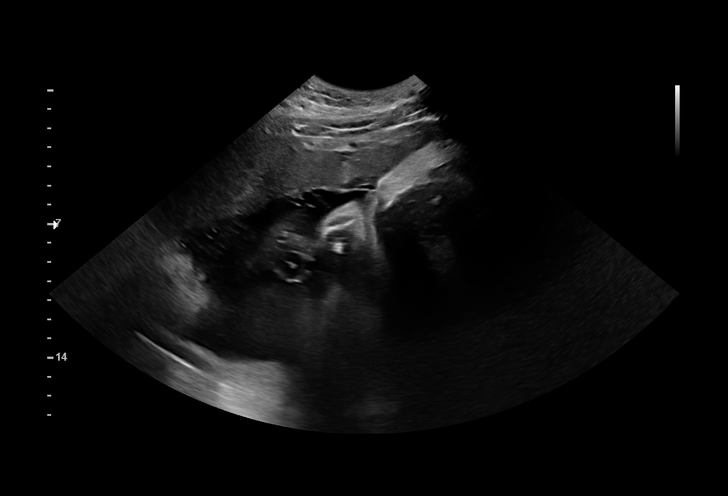
[im 20/22]
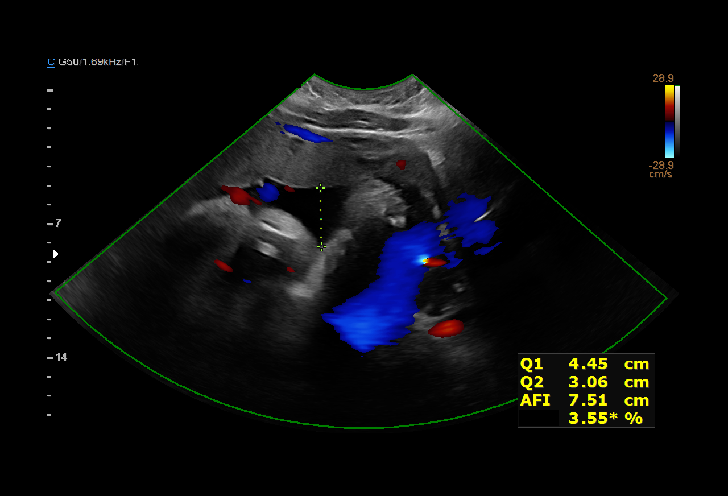
[im 22/22]
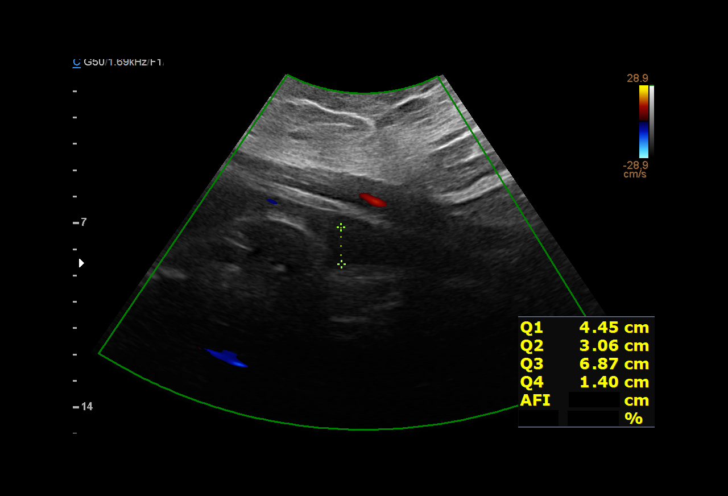

[13 of 22 positions shown; findings below may reference images not displayed]

Indications

 Pre-existing diabetes, type 2, in pregnancy,
 third trimester (insulin)
 34 weeks gestation of pregnancy
 Poor obstetric history: Previous preterm
 delivery, antepartum x 2 (both c/s due to pre
 e)
 Hypertension - Chronic/Pre-existing
 Previous cesarean delivery, antepartum x 2
 Low risk NIPS/neg AFP
Fetal Evaluation

 Num Of Fetuses:         1
 Fetal Heart Rate(bpm):  160
 Cardiac Activity:       Observed
 Presentation:           Cephalic
 Placenta:               Anterior
 P. Cord Insertion:      Visualized

 Amniotic Fluid
 AFI FV:      Within normal limits

 AFI Sum(cm)     %Tile       Largest Pocket(cm)
 19.59           73

 RUQ(cm)       RLQ(cm)       LUQ(cm)        LLQ(cm)

Biophysical Evaluation
 Amniotic F.V:   Pocket => 2 cm             F. Tone:        Observed
 F. Movement:    Observed                   Score:          [DATE]
 F. Breathing:   Observed
OB History

 Gravidity:    7         Term:   1        Prem:   2        SAB:   3
 TOP:          0       Ectopic:  0        Living: 3
Gestational Age

 LMP:           34w 0d        Date:  12/03/19                 EDD:   09/08/20
 Best:          34w 0d     Det. By:  LMP  (12/03/19)          EDD:   09/08/20
Comments

 Tu Leprince was seen for a biophysical profile due to
 pregestational diabetes treated with insulin and chronic
 hypertension treated with Procardia.  The patient had a P/C
 ratio performed earlier this week that was [DATE].  Her PIH
 labs were all within normal limits.  Her serum creatinine level
 was slightly elevated (0.96).  However, this is improved from
 the serum creatinine level noted earlier in her pregnancy.
 The patient reports that she was diagnosed with diabetes
 about 16 years ago.  She had been treated with lisinopril for
 blood pressure control outside of pregnancy.  She was
 recently started on Procardia for blood pressure control.
 Her pregnancy history is significant for two indicated preterm
 deliveries at 27 and 34 weeks due to severe preeclampsia.
 She has two prior cesarean deliveries.
 She denies any signs or symptoms of preeclampsia today.
 Her blood pressure today was 130/84.
 A biophysical profile performed today was [DATE].
 There was normal amniotic fluid noted on today's ultrasound
 exam.
 The following were discussed during today's consultation:
 Elevated P/C ratio in the setting of chronic hypertension and
 pre-gestational diabetes
 The patient was advised regarding the concern that she may
 be developing preeclampsia due to her significantly
 increased P/C ratio.
 I reviewed the patient's chart in [REDACTED] today.  It does not
 appear that she had a prior P/C ratio performed or a baseline
 24-hour urine collected.
 Based on her elevated serum creatinine levels noted earlier
 in her pregnancy and her long history of pregestational
 diabetes, I suspect that the patient probably has underlying
 kidney disease and probably had significant proteinuria prior
 to pregnancy.
 She was advised that as she did not have any evaluation of
 proteinuria earlier in her pregnancy, it remains undetermined
 if the significant proteinuria is due to preeclampsia or
 underlying kidney disease.
 As her PIH labs are all within normal limits, as she is
 asymptomatic, and as her blood pressures are well
 controlled, we will continue close observation for now.
 The patient was offered to come in for twice-weekly fetal
 testing.  She declined as she lives too far away.  She will
 continue weekly fetal testing until delivery.
 I have scheduled a growth ultrasound for her in addition to
 the biophysical profile in 1 week.  We will repeat her PIH labs
 again in 1 week.
 Due to concerns regarding preeclampsia and due to her
 underlying medical conditions, delivery is recommended at
 around 36 weeks (in 2 weeks).
 Due to pregestational diabetes and as she will be delivering
 in the late preterm period, a course of antenatal
 corticosteroids will not be necessary prior to delivery.
 The patient understands that there may be a small risk that
 her baby may require a NICU admission for delivery at 36
 weeks.  She is willing to accept the small risk.
 She also understands that she will most likely require a
 repeat cesarean delivery.
 Preeclampsia precautions were reviewed today.  She
 understands that she should go to the hospital should she
 experience any signs or symptoms of severe preeclampsia.
 A total of 25 minutes was spent counseling and coordinating
 the care for this patient.  Greater than 50% of the time was
 spent in direct face-to-face contact.
Recommendations

 Repeat biophysical profile and growth scan in 1 week
 Repeat PIH labs in 1 week
 Delivery at 36 weeks (antenatal corticosteroids are not
 indicated prior to delivery)
 Delivery prior to 36 weeks should she complain of any signs
 or symptoms of severe preeclampsia

## 2022-03-29 IMAGING — US US MFM FETAL BPP W/O NON-STRESS
1 series · 14 of 28 positions shown · non-contrast
Comparison: none

[Series 1: us mfm fetal bpp w/o non-stress · 56 acquisitions, 14 frames shown]
[im 3/56]
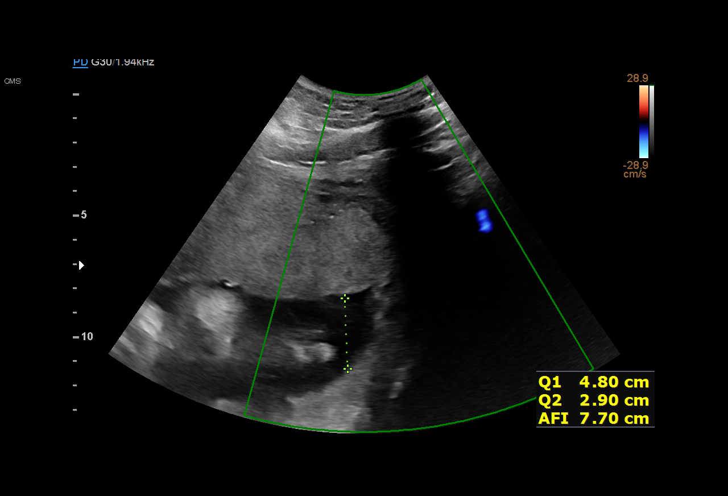
[im 7/56]
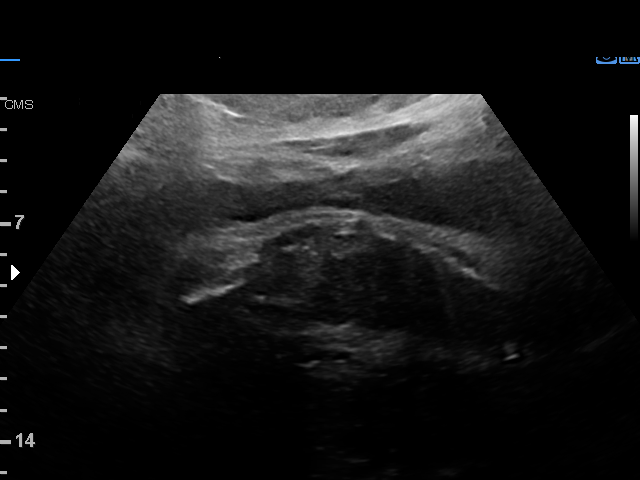
[im 11/56]
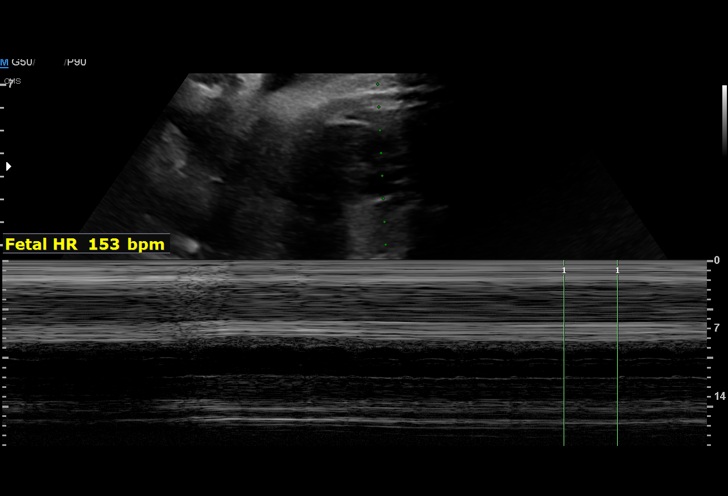
[im 15/56]
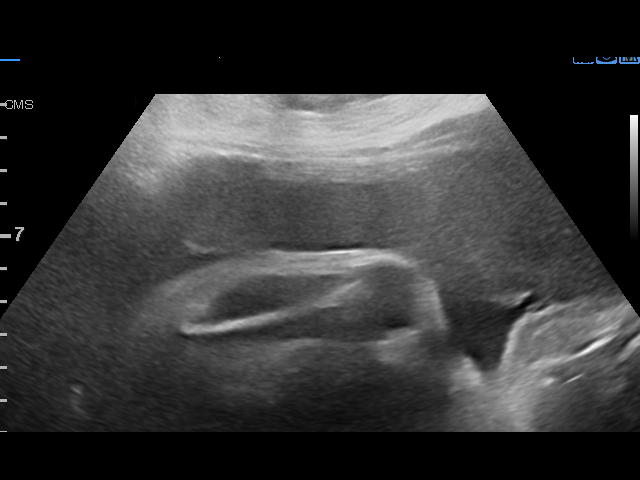
[im 19/56]
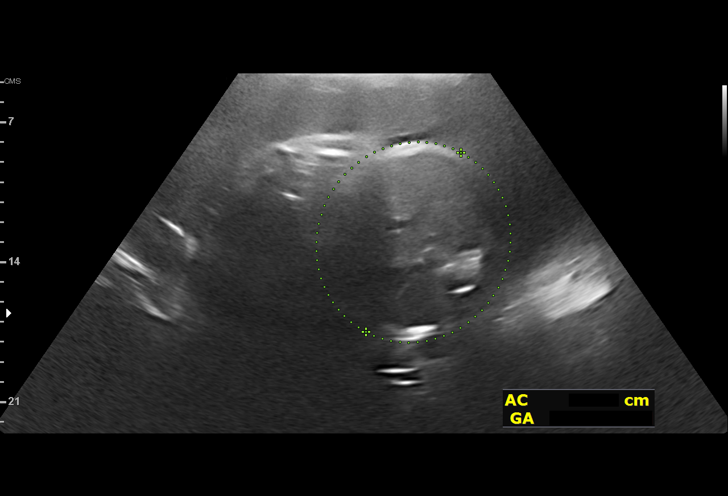
[im 23/56]
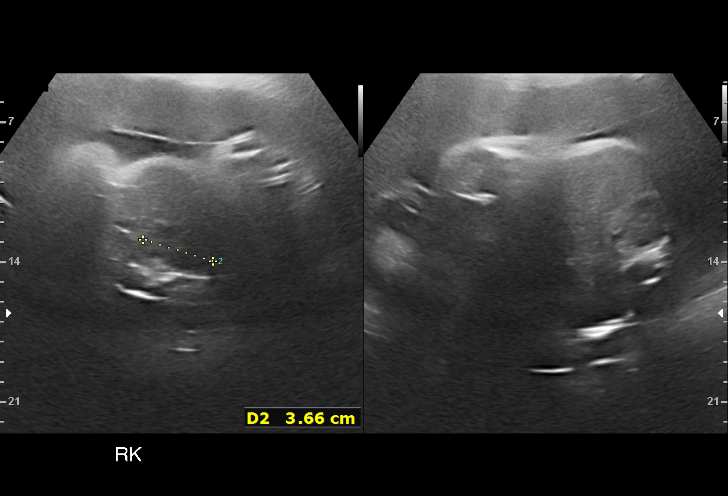
[im 27/56]
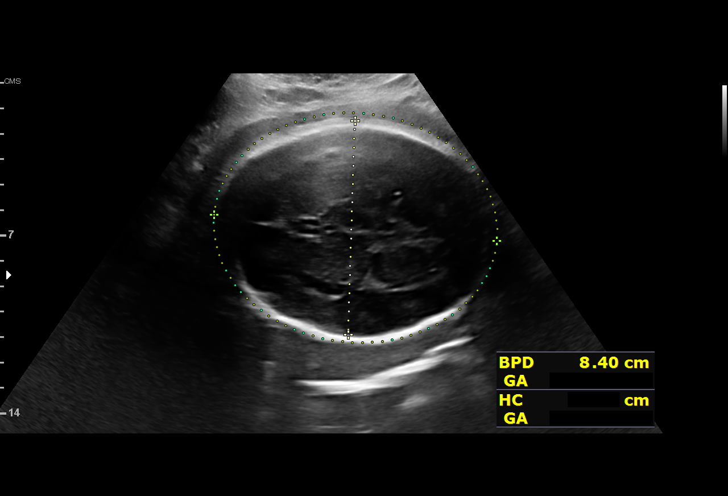
[im 31/56]
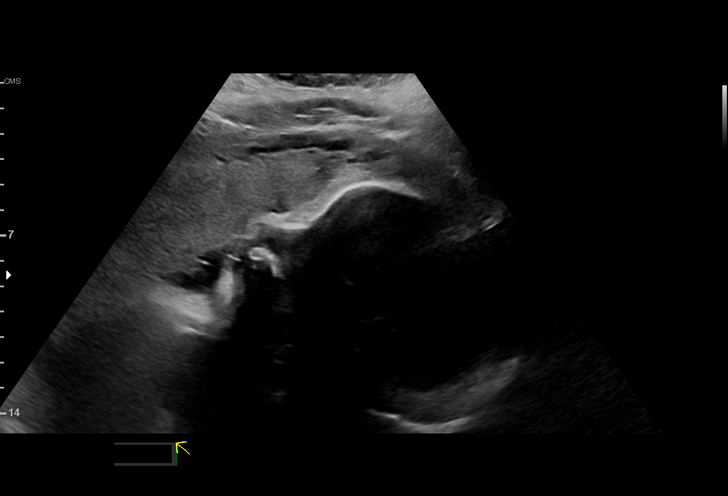
[im 35/56]
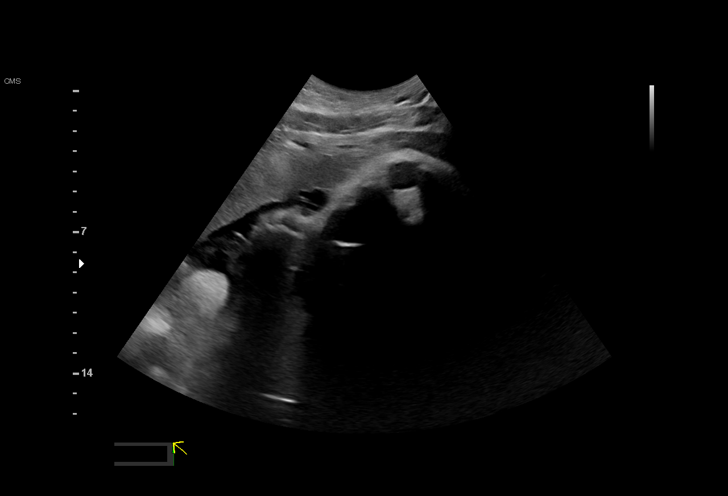
[im 39/56]
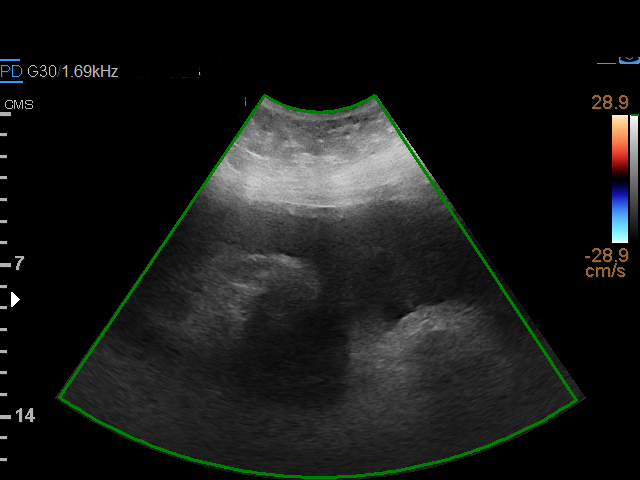
[im 43/56]
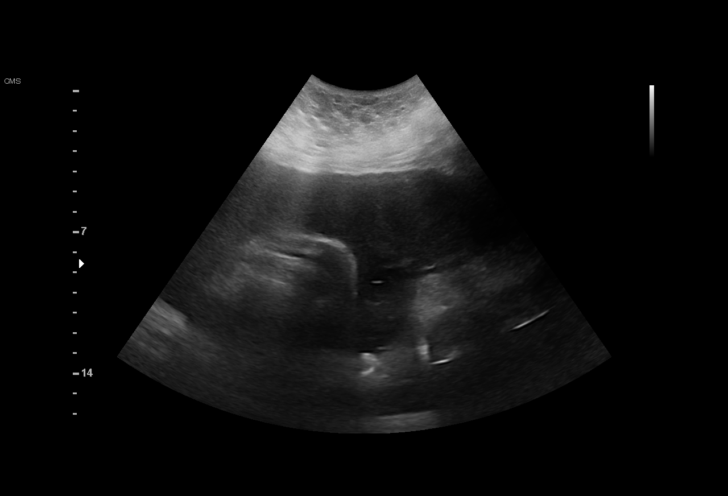
[im 47/56]
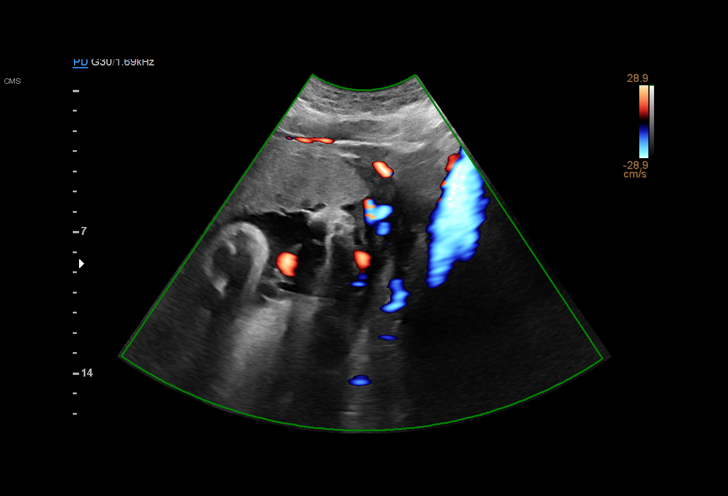
[im 51/56]
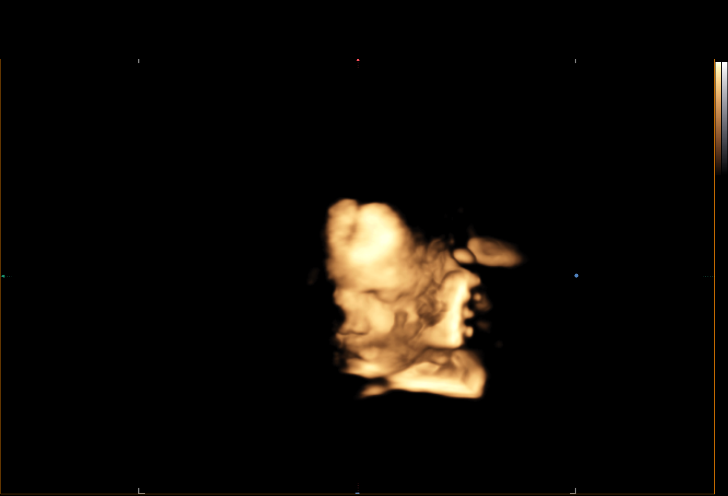
[im 56/56]
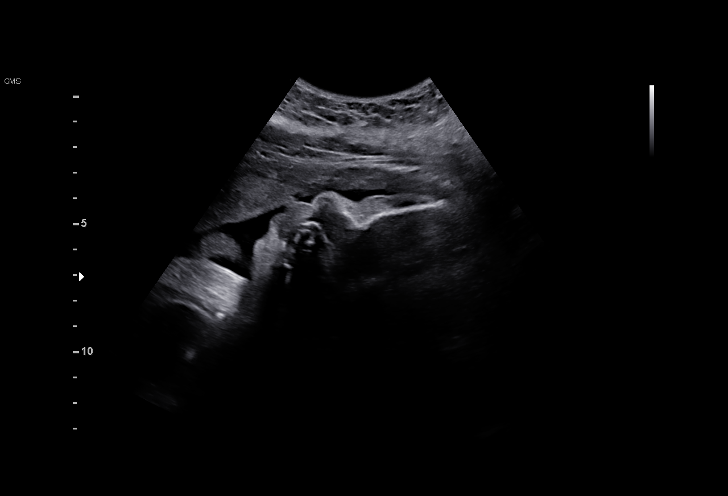

[14 of 28 positions shown; findings below may reference images not displayed]

Indications

 35 weeks gestation of pregnancy
 Pre-existing diabetes, type 2, in pregnancy,
 third trimester (insulin)
 Poor obstetric history: Previous preterm
 delivery, antepartum x 2 (both c/s due to pre
 e)
 Hypertension - Chronic/Pre-existing
 Previous cesarean delivery, antepartum x 2
 Low risk NIPS/neg AFP
Fetal Evaluation

 Num Of Fetuses:         1
 Fetal Heart Rate(bpm):  153
 Cardiac Activity:       Observed
 Presentation:           Cephalic
 Placenta:               Anterior
 P. Cord Insertion:      Previously Visualized

 Amniotic Fluid
 AFI FV:      Within normal limits

 AFI Sum(cm)     %Tile       Largest Pocket(cm)
 14.1            50

 RUQ(cm)       RLQ(cm)       LUQ(cm)        LLQ(cm)
 4.8           1.3           4              4
Biophysical Evaluation
 Amniotic F.V:   Within normal limits       F. Tone:        Observed
 F. Movement:    Observed                   Score:          [DATE]
 F. Breathing:   Observed
Biometry

 BPD:      84.6  mm     G. Age:  34w 1d         26  %    CI:        72.51   %    70 - 86
                                                         FL/HC:      19.9   %    20.1 -
 HC:       316   mm     G. Age:  35w 3d         28  %    HC/AC:      1.02        0.93 -
 AC:       310   mm     G. Age:  35w 0d         55  %    FL/BPD:     74.5   %    71 - 87
 FL:         63  mm     G. Age:  32w 4d        2.8  %    FL/AC:      20.3   %    20 - 24

 LV:        2.1  mm

 Est. FW:    0925  gm      5 lb 4 oz     27  %
OB History

 Gravidity:    7         Term:   1        Prem:   2        SAB:   3
 TOP:          0       Ectopic:  0        Living: 3
Gestational Age

 LMP:           35w 0d        Date:  12/03/19                 EDD:   09/08/20
 U/S Today:     34w 2d                                        EDD:   09/13/20
 Best:          35w 0d     Det. By:  LMP  (12/03/19)          EDD:   09/08/20
Anatomy

 Cranium:               Appears normal         LVOT:                   Not well visualized
 Cavum:                 Previously seen        Aortic Arch:            Previously seen
 Ventricles:            Previously seen        Ductal Arch:            Not well visualized
 Choroid Plexus:        Previously seen        Diaphragm:              Previously seen
 Cerebellum:            Previously seen        Stomach:                Appears normal, left
                                                                       sided
 Posterior Fossa:       Previously seen        Abdomen:                Appears normal
 Nuchal Fold:           Not applicable (>20    Abdominal Wall:         Previously seen
                        wks GA)
 Face:                  Orbits and profile     Cord Vessels:           Previously seen
                        previously seen
 Lips:                  Previously seen        Kidneys:                NORLYS appears normal,
                                                                       PATERNOSTRE prev vis
 Palate:                Not well visualized    Bladder:                Appears normal
 Thoracic:              Previously seen        Spine:                  Previously seen
 Heart:                 Not well visualized    Upper Extremities:      Previously seen
 RVOT:                  Previously seen        Lower Extremities:      Previously seen

 Other:  Open hands/5th digits and feet/RIGHT heel visualized previously
         visualized. Lenses and nasal bone prev. visualized. Male gender prev
         visualized.  Technically difficult due to maternal habitus.
Impression

 Follow up growth for chronic hypertension and gestational
 diabetes
 Normal interval growth with measurements consistent with
 dates
 Good fetal movement and amniotic fluid volume
 Biophysical profile [DATE]
 Suboptimal views of the fetal anatomy was again secondary
 to fetal position and advanced gestation.

 Blood pressure was 132/85 mmHg.
 Ms. Garay Oseguera is scheduld for cesarean delivery on [DATE]
Recommendations

 Continue weekly testing.
 Delivery scheduld on [DATE]

## 2022-04-19 ENCOUNTER — Encounter (HOSPITAL_COMMUNITY): Payer: Self-pay

## 2022-04-19 ENCOUNTER — Emergency Department (HOSPITAL_COMMUNITY): Payer: Medicaid Other

## 2022-04-19 ENCOUNTER — Inpatient Hospital Stay (HOSPITAL_COMMUNITY)
Admission: EM | Admit: 2022-04-19 | Discharge: 2022-04-23 | DRG: 682 | Disposition: A | Discharge status: Home / Self Care | Payer: Medicaid Other | Attending: Internal Medicine | Admitting: Internal Medicine

## 2022-04-19 ENCOUNTER — Other Ambulatory Visit: Payer: Self-pay

## 2022-04-19 DIAGNOSIS — M546 Pain in thoracic spine: Secondary | ICD-10-CM | POA: Diagnosis present

## 2022-04-19 DIAGNOSIS — N189 Chronic kidney disease, unspecified: Secondary | ICD-10-CM | POA: Diagnosis not present

## 2022-04-19 DIAGNOSIS — N170 Acute kidney failure with tubular necrosis: Principal | ICD-10-CM | POA: Diagnosis present

## 2022-04-19 DIAGNOSIS — E669 Obesity, unspecified: Secondary | ICD-10-CM | POA: Diagnosis not present

## 2022-04-19 DIAGNOSIS — Z833 Family history of diabetes mellitus: Secondary | ICD-10-CM

## 2022-04-19 DIAGNOSIS — Z888 Allergy status to other drugs, medicaments and biological substances status: Secondary | ICD-10-CM

## 2022-04-19 DIAGNOSIS — E1143 Type 2 diabetes mellitus with diabetic autonomic (poly)neuropathy: Secondary | ICD-10-CM | POA: Diagnosis present

## 2022-04-19 DIAGNOSIS — I160 Hypertensive urgency: Secondary | ICD-10-CM | POA: Diagnosis present

## 2022-04-19 DIAGNOSIS — N1832 Chronic kidney disease, stage 3b: Secondary | ICD-10-CM | POA: Diagnosis present

## 2022-04-19 DIAGNOSIS — I129 Hypertensive chronic kidney disease with stage 1 through stage 4 chronic kidney disease, or unspecified chronic kidney disease: Secondary | ICD-10-CM | POA: Diagnosis present

## 2022-04-19 DIAGNOSIS — Z794 Long term (current) use of insulin: Secondary | ICD-10-CM

## 2022-04-19 DIAGNOSIS — R112 Nausea with vomiting, unspecified: Secondary | ICD-10-CM | POA: Diagnosis present

## 2022-04-19 DIAGNOSIS — T383X5A Adverse effect of insulin and oral hypoglycemic [antidiabetic] drugs, initial encounter: Secondary | ICD-10-CM | POA: Diagnosis present

## 2022-04-19 DIAGNOSIS — E1110 Type 2 diabetes mellitus with ketoacidosis without coma: Secondary | ICD-10-CM | POA: Diagnosis present

## 2022-04-19 DIAGNOSIS — K3184 Gastroparesis: Secondary | ICD-10-CM | POA: Diagnosis present

## 2022-04-19 DIAGNOSIS — L84 Corns and callosities: Secondary | ICD-10-CM | POA: Diagnosis present

## 2022-04-19 DIAGNOSIS — E1149 Type 2 diabetes mellitus with other diabetic neurological complication: Secondary | ICD-10-CM | POA: Diagnosis present

## 2022-04-19 DIAGNOSIS — K8020 Calculus of gallbladder without cholecystitis without obstruction: Secondary | ICD-10-CM | POA: Diagnosis present

## 2022-04-19 DIAGNOSIS — K760 Fatty (change of) liver, not elsewhere classified: Secondary | ICD-10-CM | POA: Diagnosis present

## 2022-04-19 DIAGNOSIS — E1122 Type 2 diabetes mellitus with diabetic chronic kidney disease: Secondary | ICD-10-CM | POA: Diagnosis present

## 2022-04-19 DIAGNOSIS — G8929 Other chronic pain: Secondary | ICD-10-CM

## 2022-04-19 DIAGNOSIS — Z6834 Body mass index (BMI) 34.0-34.9, adult: Secondary | ICD-10-CM

## 2022-04-19 DIAGNOSIS — E119 Type 2 diabetes mellitus without complications: Secondary | ICD-10-CM

## 2022-04-19 DIAGNOSIS — Z7985 Long-term (current) use of injectable non-insulin antidiabetic drugs: Secondary | ICD-10-CM

## 2022-04-19 DIAGNOSIS — N179 Acute kidney failure, unspecified: Secondary | ICD-10-CM

## 2022-04-19 DIAGNOSIS — F17210 Nicotine dependence, cigarettes, uncomplicated: Secondary | ICD-10-CM | POA: Diagnosis present

## 2022-04-19 DIAGNOSIS — R1084 Generalized abdominal pain: Secondary | ICD-10-CM | POA: Diagnosis present

## 2022-04-19 DIAGNOSIS — N319 Neuromuscular dysfunction of bladder, unspecified: Secondary | ICD-10-CM | POA: Diagnosis present

## 2022-04-19 DIAGNOSIS — E860 Dehydration: Secondary | ICD-10-CM | POA: Diagnosis present

## 2022-04-19 LAB — BASIC METABOLIC PANEL
Anion gap: 15 (ref 5–15)
Anion gap: 14 (ref 5–15)
BUN: 36 mg/dL — ABNORMAL HIGH (ref 6–20)
BUN: 38 mg/dL — ABNORMAL HIGH (ref 6–20)
CO2: 18 mmol/L — ABNORMAL LOW (ref 22–32)
CO2: 20 mmol/L — ABNORMAL LOW (ref 22–32)
Calcium: 9 mg/dL (ref 8.9–10.3)
Calcium: 9.1 mg/dL (ref 8.9–10.3)
Chloride: 107 mmol/L (ref 98–111)
Chloride: 108 mmol/L (ref 98–111)
Creatinine, Ser: 3.14 mg/dL — ABNORMAL HIGH (ref 0.44–1.00)
Creatinine, Ser: 3.31 mg/dL — ABNORMAL HIGH (ref 0.44–1.00)
GFR, Estimated: 19 mL/min — ABNORMAL LOW (ref >60)
GFR, Estimated: 18 mL/min — ABNORMAL LOW (ref >60)
Glucose, Bld: 390 mg/dL — ABNORMAL HIGH (ref 70–99)
Glucose, Bld: 369 mg/dL — ABNORMAL HIGH (ref 70–99)
Potassium: 4.2 mmol/L (ref 3.5–5.1)
Potassium: 4.4 mmol/L (ref 3.5–5.1)
Sodium: 140 mmol/L (ref 135–145)
Sodium: 142 mmol/L (ref 135–145)

## 2022-04-19 LAB — URINALYSIS, ROUTINE W REFLEX MICROSCOPIC
Bilirubin Urine: NEGATIVE
Glucose, UA: >=500 mg/dL — AB
Ketones, ur: 5 mg/dL — AB
Leukocytes,Ua: NEGATIVE
Nitrite: NEGATIVE
Protein, ur: >=300 mg/dL — AB
Specific Gravity, Urine: 1.012 (ref 1.005–1.030)
pH: 6 (ref 5.0–8.0)

## 2022-04-19 LAB — GLUCOSE, CAPILLARY
Glucose-Capillary: 372 mg/dL — ABNORMAL HIGH (ref 70–99)
Glucose-Capillary: 354 mg/dL — ABNORMAL HIGH (ref 70–99)

## 2022-04-19 LAB — CBC WITH DIFFERENTIAL/PLATELET
Abs Immature Granulocytes: 0.1 10*3/uL — ABNORMAL HIGH (ref 0.00–0.07)
Basophils Absolute: 0 10*3/uL (ref 0.0–0.1)
Basophils Relative: 0 %
Eosinophils Absolute: 0 10*3/uL (ref 0.0–0.5)
Eosinophils Relative: 0 %
HCT: 36.3 % (ref 36.0–46.0)
Hemoglobin: 12.7 g/dL (ref 12.0–15.0)
Immature Granulocytes: 1 %
Lymphocytes Relative: 10 %
Lymphs Abs: 1.1 10*3/uL (ref 0.7–4.0)
MCH: 28.8 pg (ref 26.0–34.0)
MCHC: 35 g/dL (ref 30.0–36.0)
MCV: 82.3 fL (ref 80.0–100.0)
Monocytes Absolute: 0.4 10*3/uL (ref 0.1–1.0)
Monocytes Relative: 3 %
Neutro Abs: 9.2 10*3/uL — ABNORMAL HIGH (ref 1.7–7.7)
Neutrophils Relative %: 86 %
Platelets: 339 10*3/uL (ref 150–400)
RBC: 4.41 MIL/uL (ref 3.87–5.11)
RDW: 12.5 % (ref 11.5–15.5)
WBC: 10.8 10*3/uL — ABNORMAL HIGH (ref 4.0–10.5)
nRBC: 0 % (ref 0.0–0.2)

## 2022-04-19 LAB — COMPREHENSIVE METABOLIC PANEL
ALT: 21 U/L (ref 0–44)
AST: 17 U/L (ref 15–41)
Albumin: 4.1 g/dL (ref 3.5–5.0)
Alkaline Phosphatase: 212 U/L — ABNORMAL HIGH (ref 38–126)
Anion gap: 14 (ref 5–15)
BUN: 36 mg/dL — ABNORMAL HIGH (ref 6–20)
CO2: 17 mmol/L — ABNORMAL LOW (ref 22–32)
Calcium: 9.2 mg/dL (ref 8.9–10.3)
Chloride: 105 mmol/L (ref 98–111)
Creatinine, Ser: 3 mg/dL — ABNORMAL HIGH (ref 0.44–1.00)
GFR, Estimated: 20 mL/min — ABNORMAL LOW (ref >60)
Glucose, Bld: 362 mg/dL — ABNORMAL HIGH (ref 70–99)
Potassium: 4.2 mmol/L (ref 3.5–5.1)
Sodium: 136 mmol/L (ref 135–145)
Total Bilirubin: 0.9 mg/dL (ref 0.3–1.2)
Total Protein: 8.5 g/dL — ABNORMAL HIGH (ref 6.5–8.1)

## 2022-04-19 LAB — I-STAT CHEM 8, ED
BUN: 34 mg/dL — ABNORMAL HIGH (ref 6–20)
Calcium, Ion: 1.22 mmol/L (ref 1.15–1.40)
Chloride: 109 mmol/L (ref 98–111)
Creatinine, Ser: 3.3 mg/dL — ABNORMAL HIGH (ref 0.44–1.00)
Glucose, Bld: 377 mg/dL — ABNORMAL HIGH (ref 70–99)
HCT: 37 % (ref 36.0–46.0)
Hemoglobin: 12.6 g/dL (ref 12.0–15.0)
Potassium: 4.4 mmol/L (ref 3.5–5.1)
Sodium: 140 mmol/L (ref 135–145)
TCO2: 21 mmol/L — ABNORMAL LOW (ref 22–32)

## 2022-04-19 LAB — I-STAT BETA HCG BLOOD, ED (MC, WL, AP ONLY): I-stat hCG, quantitative: <5 m[IU]/mL (ref <5)

## 2022-04-19 LAB — LIPASE, BLOOD: Lipase: 51 U/L (ref 11–51)

## 2022-04-19 MED ORDER — OXYCODONE HCL 5 MG PO TABS
10.0000 mg | ORAL_TABLET | Freq: Once | ORAL | Status: AC
  Administered 2022-04-19: 10 mg via ORAL
  Filled 2022-04-19: qty 2

## 2022-04-19 MED ORDER — LABETALOL HCL 5 MG/ML IV SOLN
10.0000 mg | Freq: Once | INTRAVENOUS | Status: AC
  Administered 2022-04-19: 10 mg via INTRAVENOUS
  Filled 2022-04-19: qty 4

## 2022-04-19 MED ORDER — ONDANSETRON HCL 4 MG/2ML IJ SOLN
4.0000 mg | Freq: Once | INTRAMUSCULAR | Status: AC
  Administered 2022-04-19: 4 mg via INTRAVENOUS
  Filled 2022-04-19: qty 2

## 2022-04-19 MED ORDER — ACETAMINOPHEN 650 MG RE SUPP: 650.0000 mg | Freq: Four times a day (QID) | RECTAL | Status: DC | PRN

## 2022-04-19 MED ORDER — INSULIN ASPART 100 UNIT/ML IJ SOLN
0.0000 [IU] | Freq: Three times a day (TID) | INTRAMUSCULAR | Status: DC
  Administered 2022-04-20: 5 [IU] via SUBCUTANEOUS
  Administered 2022-04-20: 15 [IU] via SUBCUTANEOUS
  Administered 2022-04-20 – 2022-04-21 (×4): 5 [IU] via SUBCUTANEOUS
  Administered 2022-04-22: 3 [IU] via SUBCUTANEOUS
  Administered 2022-04-22: 15 [IU] via SUBCUTANEOUS
  Administered 2022-04-22: 5 [IU] via SUBCUTANEOUS
  Administered 2022-04-23: 8 [IU] via SUBCUTANEOUS
  Administered 2022-04-23: 2 [IU] via SUBCUTANEOUS

## 2022-04-19 MED ORDER — INSULIN GLARGINE-YFGN 100 UNIT/ML ~~LOC~~ SOLN
10.0000 [IU] | Freq: Every day | SUBCUTANEOUS | Status: DC
  Administered 2022-04-19 – 2022-04-20 (×2): 10 [IU] via SUBCUTANEOUS
  Filled 2022-04-19 (×2): qty 0.1
  Filled 2022-04-19: qty 10
  Filled 2022-04-19: qty 0.1

## 2022-04-19 MED ORDER — FENTANYL CITRATE (PF) 100 MCG/2ML IJ SOLN
100.0000 ug | Freq: Once | INTRAMUSCULAR | Status: AC
  Administered 2022-04-19: 100 ug via INTRAVENOUS
  Filled 2022-04-19: qty 2

## 2022-04-19 MED ORDER — HYDROMORPHONE HCL 1 MG/ML IJ SOLN
1.0000 mg | Freq: Once | INTRAMUSCULAR | Status: AC
  Administered 2022-04-19: 1 mg via INTRAVENOUS
  Filled 2022-04-19: qty 1

## 2022-04-19 MED ORDER — SODIUM CHLORIDE 0.9 % IV BOLUS
1000.0000 mL | Freq: Once | INTRAVENOUS | Status: AC
  Administered 2022-04-19: 1000 mL via INTRAVENOUS

## 2022-04-19 MED ORDER — ENOXAPARIN SODIUM 40 MG/0.4ML IJ SOSY
40.0000 mg | PREFILLED_SYRINGE | INTRAMUSCULAR | Status: DC
  Administered 2022-04-19 – 2022-04-21 (×3): 40 mg via SUBCUTANEOUS
  Filled 2022-04-19 (×3): qty 0.4

## 2022-04-19 MED ORDER — OXYCODONE HCL 5 MG PO TABS
5.0000 mg | ORAL_TABLET | ORAL | Status: DC | PRN
  Administered 2022-04-19 – 2022-04-23 (×8): 5 mg via ORAL
  Filled 2022-04-19 (×9): qty 1

## 2022-04-19 MED ORDER — MORPHINE SULFATE (PF) 2 MG/ML IV SOLN
1.0000 mg | INTRAVENOUS | Status: DC | PRN
  Administered 2022-04-19 – 2022-04-21 (×6): 1 mg via INTRAVENOUS
  Filled 2022-04-19 (×6): qty 1

## 2022-04-19 MED ORDER — ONDANSETRON HCL 4 MG/2ML IJ SOLN
4.0000 mg | Freq: Four times a day (QID) | INTRAMUSCULAR | Status: DC | PRN
  Administered 2022-04-19 – 2022-04-21 (×4): 4 mg via INTRAVENOUS
  Filled 2022-04-19 (×5): qty 2

## 2022-04-19 MED ORDER — PANTOPRAZOLE SODIUM 40 MG IV SOLR
40.0000 mg | INTRAVENOUS | Status: DC
  Administered 2022-04-19 – 2022-04-22 (×4): 40 mg via INTRAVENOUS
  Filled 2022-04-19 (×4): qty 10

## 2022-04-19 MED ORDER — INSULIN ASPART 100 UNIT/ML IJ SOLN
0.0000 [IU] | Freq: Three times a day (TID) | INTRAMUSCULAR | Status: DC
  Administered 2022-04-19: 9 [IU] via SUBCUTANEOUS

## 2022-04-19 MED ORDER — LACTATED RINGERS IV SOLN: INTRAVENOUS | Status: DC

## 2022-04-19 MED ORDER — SODIUM CHLORIDE 0.9 % IV SOLN
12.5000 mg | Freq: Four times a day (QID) | INTRAVENOUS | Status: DC | PRN
  Administered 2022-04-19 – 2022-04-22 (×8): 12.5 mg via INTRAVENOUS
  Filled 2022-04-19 (×5): qty 0.5

## 2022-04-19 MED ORDER — PROMETHAZINE HCL 25 MG/ML IJ SOLN
INTRAMUSCULAR | Status: AC
  Filled 2022-04-19: qty 1

## 2022-04-19 MED ORDER — ONDANSETRON HCL 4 MG PO TABS: 4.0000 mg | ORAL_TABLET | Freq: Four times a day (QID) | ORAL | Status: DC | PRN

## 2022-04-19 MED ORDER — ACETAMINOPHEN 325 MG PO TABS
650.0000 mg | ORAL_TABLET | Freq: Four times a day (QID) | ORAL | Status: DC | PRN
  Administered 2022-04-21: 650 mg via ORAL
  Filled 2022-04-19: qty 2

## 2022-04-19 NOTE — Assessment & Plan Note (Signed)
Likely side effect of GLP 1 agonist  Plan to continue supportive medical care with IV fluids, IV antiacids. As needed analgesics and antiemetics.

## 2022-04-19 NOTE — Assessment & Plan Note (Signed)
Calculated BMI is 34,7 

## 2022-04-19 NOTE — ED Notes (Addendum)
Messaged Pharmacy about bringing Phenergan

## 2022-04-19 NOTE — Assessment & Plan Note (Signed)
CKD stage 2.  Plan to continue IV fluids with balanced electrolytes solutions. Follow up renal panel in am. Avoid hypotension and nephrotoxic medications.

## 2022-04-19 NOTE — ED Notes (Signed)
Patient transported to CT 

## 2022-04-19 NOTE — ED Notes (Signed)
Mother's Contact information. [phone] Arlyss Repress.

## 2022-04-19 NOTE — Assessment & Plan Note (Signed)
Continue pain control with oxycodone and Iv morphine

## 2022-04-19 NOTE — H&P (Signed)
History and Physical    Patient: Lindsay Burnett TKP:[id number] DOB: 1990-02-05 DOA: 04/19/2022 DOS: the patient was seen and examined on 04/19/2022 PCP: Patient, No Pcp Per  Patient coming from: Home   Chief Complaint:  Chief Complaint  Patient presents with   Back Pain    Pt complains of sharp pain that shoots from the middle up to the neck   HPI: Lindsay Burnett is a 32 y.o. female with medical history significant of obesity, T2DM, CKD and hypertension induced pregnancy.    Patient has been at her usual state of health until yesterday morning, when she started experiencing nausea, vomiting and abdominal pain. Symptoms started a few hours after injecting ozempic. Since then she has not been able to eat due to nausea and vomiting, that have been constant and persistent. Her last meal was the day before yesterday.  She does not identity any improving or worsening factors, her abdominal pain has been epigastric with no radiation. Through the course of the day yesterday her chronic back pain has exacerbated now being more persistent and severe. Because persistent symptoms she presented to the ED for further evaluation.   Review of Systems: As mentioned in the history of present illness. All other systems reviewed and are negative. Past Medical History:  Diagnosis Date   Blood transfusion without reported diagnosis    Diabetes mellitus without complication    Pregnancy induced hypertension    Past Surgical History:  Procedure Laterality Date   CESAREAN SECTION     2 cesarean sections   CESAREAN SECTION N/A 08/12/2020   Procedure: CESAREAN SECTION, ADD LILETTA IUD;  Surgeon: Tereso Newcomer, MD;  Location: [facility];  Service: Obstetrics;  Laterality: N/A;   Social History:  reports that she has been smoking cigarettes. She has a 2.00 pack-year smoking history. She has never used smokeless tobacco. She reports that she does not currently use alcohol. She reports that she does not use  drugs.  Allergies  Allergen Reactions   Compazine [Prochlorperazine Edisylate]     "Stroke like symptoms"   Reglan [Metoclopramide]     "Stroke like symptoms"    Family History  Problem Relation Age of Onset   Diabetes Mother    Diabetes Maternal Grandmother     Prior to Admission medications   Medication Sig Start Date End Date Taking? Authorizing Provider  insulin glargine (LANTUS SOLOSTAR) 100 UNIT/ML Solostar Pen Inject 10 Units into the skin at bedtime. 08/15/20  Yes Anyanwu, Jethro Bastos, MD  insulin lispro (HUMALOG) 100 UNIT/ML KwikPen Inject 5-8 Units into the skin 3 (three) times daily with meals. 01/12/19  Yes [provider]  OZEMPIC, 1 MG/DOSE, 4 MG/3ML SOPN Inject 1 mg into the skin once a week.   Yes [provider]    Physical Exam: Vitals:   04/19/22 1113 04/19/22 1219 04/19/22 1505 04/19/22 1718  BP: (!) 199/112 (!) 186/103 (!) 194/104 (!) 175/97  Pulse:  (!) 108 (!) 110 (!) 103  Resp: 20 18 18 20   Temp:   97.9 F (36.6 C) 98.5 F (36.9 C)  TempSrc:   Oral Oral  SpO2:  98% 100% 100%  Weight:   104.3 kg   Height:   5\' 8"  (1.727 m)    Neurology awake and alert ENT with mild pallor, dry mucous membranes Cardiovascular with S1 and S2 present and rhythmic with no gallops, rubs or murmurs Respiratory with no wheezing or rales, no rhonchi Abdomen with no distention, positive tenderness at deep  palpation at the epigastrium with no rebound or guarding.  No lower extremity edema Tender to palpation lower spine  Data Reviewed:   Na 136, K 4,2 Cl 105 bicarbonate 17 glucose 132, bun 36, cr 3,0 AST 17 ALT 21  Wbc 10,8 hgb 12,7 plt 339  Urine analysis with SG 1,012, protein > 300, glucose > 500, negative leukocytes.   Ct abdomen with hepatic steatosis, mild cholelithiasis with no evidence of inflammation.  Chest radiograph with no infiltrates or effusions.   32 yo female with T2DM, CKD and obesity who presented with 24 hrs of nausea and vomiting,  complicated with abdominal pain and back pain, not able to tolerate po (intractable nausea and vomiting), on her initial physical examination she is hypertensive, with dry mucous membranes, abdomen is tender with no rebound or guarding . She has worseing renal function and and anion gap metabolic acidosis with hyperglycemia.   Dx intractable nausea and vomiting, possible GLP1 agonist side effect, complicated with renal failure, and ketoacidosis.   Assessment and Plan: Intractable nausea and vomiting Likely side effect of GLP 1 agonist  Plan to continue supportive medical care with IV fluids, IV antiacids. As needed analgesics and antiemetics.    Acute kidney injury superimposed on chronic kidney disease CKD stage 2.  Plan to continue IV fluids with balanced electrolytes solutions. Follow up renal panel in am. Avoid hypotension and nephrotoxic medications.   Type 2 diabetes mellitus Positive mild DKA.  Will give basal insulin today 10 units Close follow up on BMP Continue insulin sliding scale for glucose cover and monitoring.  If worsening anion gap may need iv insulin   Chronic back pain Continue pain control with oxycodone and Iv morphine  Class 1 obesity Calculated BMI is 34,7       Advance Care Planning:   Code Status: Full Code   Consults: none   Family Communication: no family at the bedside   Severity of Illness: The appropriate patient status for this patient is OBSERVATION. Observation status is judged to be reasonable and necessary in order to provide the required intensity of service to ensure the patient's safety. The patient's presenting symptoms, physical exam findings, and initial radiographic and laboratory data in the context of their medical condition is felt to place them at decreased risk for further clinical deterioration. Furthermore, it is anticipated that the patient will be medically stable for discharge from the hospital within 2 midnights of  admission.   Author: Coralie Keens, MD 04/19/2022 6:07 PM  For on call review [email or web address].

## 2022-04-19 NOTE — ED Provider Notes (Signed)
[place] EMERGENCY DEPARTMENT AT [facility] Provider Note   CSN: [id number] Arrival date & time: 04/19/22  4098     History  Chief Complaint  Patient presents with   Back Pain    Pt complains of sharp pain that shoots from the middle up to the neck    Lindsay Burnett is a 32 y.o. female.  Patient with past medical history significant for type II DM, hypertension, chronic kidney disease stage III, presents to the emergency department complaining of back pain radiating from thoracic region up towards the neck.  Patient states the pain "moves around".  This pain has been ongoing since yesterday.  She states that yesterday she took Ozempic for the first time since November.  She states that since taking the Ozempic she has noticed the back pain and also had some abdominal discomfort with nausea and vomiting.  She currently denies chest pain, shortness of breath, urinary symptoms, vaginal discharge, vaginal bleeding, headaches, weakness.  She was seen in the emergency department and admitted to the hospital in November of last year due to an acute kidney injury.  She was also diagnosed with hypertensive urgency at that time.  Her creatinine was found to be approximately 3.5.  Her baseline is around 1.5.  Upon discharge she was started on amlodipine and labetalol.  She states that since the very initial prescription she has had no follow-up medical care and has not been taking any medications for her hypertension.  She also states that she has not seen her primary care provider, reportedly normally seen over telehealth visits, and every 3 months as well.  She states she was told "they do not need to see me anymore".  She previously took Humalog and Lantus along with metformin for her diabetes.  She was taking Ozempic but stopped after the hospitalization in November she was scared that the Ozempic may have caused the hospitalization.  She is taking no more blood pressure medication since the  initial prescription sent by the hospital in November.  She states she has tried to find a doctor for diabetic footcare but has been unable to locate one in the [place] region.  The patient is unclear as to why she decided to start Ozempic again yesterday.  HPI     Home Medications Prior to Admission medications   Medication Sig Start Date End Date Taking? Authorizing Provider  insulin glargine (LANTUS SOLOSTAR) 100 UNIT/ML Solostar Pen Inject 10 Units into the skin at bedtime. 08/15/20  Yes Anyanwu, Jethro Bastos, MD  insulin lispro (HUMALOG) 100 UNIT/ML KwikPen Inject 5-8 Units into the skin 3 (three) times daily with meals. 01/12/19  Yes [provider]  OZEMPIC, 1 MG/DOSE, 4 MG/3ML SOPN Inject 1 mg into the skin once a week.   Yes [provider]      Allergies    Compazine [prochlorperazine edisylate] and Reglan [metoclopramide]    Review of Systems   Review of Systems  Physical Exam Updated Vital Signs BP (!) 186/103 (BP Location: Left Arm)   Pulse (!) 108   Temp 98.1 F (36.7 C)   Resp 18   LMP 04/15/2022   SpO2 98%  Physical Exam Vitals and nursing note reviewed.  Constitutional:      Appearance: She is well-developed.     Comments: Patient appears uncomfortable, rocking back and forth in bed holding her back  HENT:     Head: Normocephalic and atraumatic.     Mouth/Throat:  Mouth: Mucous membranes are moist.  Eyes:     Conjunctiva/sclera: Conjunctivae normal.  Cardiovascular:     Rate and Rhythm: Normal rate and regular rhythm.     Heart sounds: No murmur heard. Pulmonary:     Effort: Pulmonary effort is normal. No respiratory distress.     Breath sounds: Normal breath sounds.  Abdominal:     Palpations: Abdomen is soft.     Tenderness: There is no abdominal tenderness (No significant abdominal tenderness on exam, no CVA tenderness noted.). There is no right CVA tenderness or left CVA tenderness.  Musculoskeletal:        General: No  swelling.     Cervical back: Neck supple.  Skin:    General: Skin is warm and dry.     Capillary Refill: Capillary refill takes less than 2 seconds.     Comments: Discolored area on the dorsal portion of the fourth toe of left foot.  Normal cap refill appreciated in bilateral feet.  Bilateral toes do feel cool to the touch.  Patient does have strong bilateral pedal pulses  Neurological:     Mental Status: She is alert.  Psychiatric:        Mood and Affect: Mood normal.     ED Results / Procedures / Treatments   Labs (all labs ordered are listed, but only abnormal results are displayed) Labs Reviewed  CBC WITH DIFFERENTIAL/PLATELET - Abnormal; Notable for the following components:      Result Value   WBC 10.8 (*)    Neutro Abs 9.2 (*)    Abs Immature Granulocytes 0.10 (*)    All other components within normal limits  COMPREHENSIVE METABOLIC PANEL - Abnormal; Notable for the following components:   CO2 17 (*)    Glucose, Bld 362 (*)    BUN 36 (*)    Creatinine, Ser 3.00 (*)    Total Protein 8.5 (*)    Alkaline Phosphatase 212 (*)    GFR, Estimated 20 (*)    All other components within normal limits  URINALYSIS, ROUTINE W REFLEX MICROSCOPIC - Abnormal; Notable for the following components:   APPearance HAZY (*)    Glucose, UA >=500 (*)    Hgb urine dipstick SMALL (*)    Ketones, ur 5 (*)    Protein, ur >=300 (*)    Bacteria, UA RARE (*)    All other components within normal limits  I-STAT CHEM 8, ED - Abnormal; Notable for the following components:   BUN 34 (*)    Creatinine, Ser 3.30 (*)    Glucose, Bld 377 (*)    TCO2 21 (*)    All other components within normal limits  LIPASE, BLOOD  I-STAT BETA HCG BLOOD, ED ([facility], [facility], [facility] ONLY)    EKG None  Radiology CT ABDOMEN PELVIS WO CONTRAST  Result Date: 04/19/2022 CLINICAL DATA:  Acute generalized abdominal pain. EXAM: CT ABDOMEN AND PELVIS WITHOUT CONTRAST TECHNIQUE: Multidetector CT imaging of the abdomen and pelvis was  performed following the standard protocol without IV contrast. RADIATION DOSE REDUCTION: This exam was performed according to the departmental dose-optimization program which includes automated exposure control, adjustment of the mA and/or kV according to patient size and/or use of iterative reconstruction technique. COMPARISON:  November 18, 2021. FINDINGS: Lower chest: No acute abnormality. Hepatobiliary: Mild cholelithiasis is noted. No biliary dilatation is noted. Hepatic steatosis. Pancreas: Unremarkable. No pancreatic ductal dilatation or surrounding inflammatory changes. Spleen: Normal in size without focal abnormality. Adrenals/Urinary Tract: Adrenal glands  are unremarkable. Kidneys are normal, without renal calculi, focal lesion, or hydronephrosis. Bladder is unremarkable. Stomach/Bowel: Stomach is within normal limits. Appendix appears normal. No evidence of bowel wall thickening, distention, or inflammatory changes. Vascular/Lymphatic: No significant vascular findings are present. No enlarged abdominal or pelvic lymph nodes. Reproductive: Uterus and bilateral adnexa are unremarkable. Other: No abdominal wall hernia or abnormality. No abdominopelvic ascites. Musculoskeletal: No acute or significant osseous findings. IMPRESSION: Hepatic steatosis. Mild cholelithiasis without evidence of inflammation. Electronically Signed   By: Lupita Raider M.D.   On: 04/19/2022 12:49   DG Chest Port 1 View  Result Date: 04/19/2022 CLINICAL DATA:  Abdominal pain EXAM: PORTABLE CHEST 1 VIEW COMPARISON:  CT 08/03/2018 FINDINGS: Heart size and mediastinal contours are unremarkable. Lung volumes are low. No pleural fluid, interstitial edema or airspace disease. The visualized osseous structures are unremarkable. IMPRESSION: 1. Low lung volumes. 2. No acute findings. Electronically Signed   By: Signa Kell M.D.   On: 04/19/2022 11:29    Procedures Procedures    Medications Ordered in ED Medications   promethazine (PHENERGAN) 12.5 mg in sodium chloride 0.9 % 50 mL IVPB (0 mg Intravenous Stopped 04/19/22 1300)  fentaNYL (SUBLIMAZE) injection 100 mcg (100 mcg Intravenous Given 04/19/22 1009)  ondansetron (ZOFRAN) injection 4 mg (4 mg Intravenous Given 04/19/22 1009)  sodium chloride 0.9 % bolus 1,000 mL (0 mLs Intravenous Stopped 04/19/22 1218)  labetalol (NORMODYNE) injection 10 mg (10 mg Intravenous Given 04/19/22 1125)  oxyCODONE (Oxy IR/ROXICODONE) immediate release tablet 10 mg (10 mg Oral Given 04/19/22 1122)  HYDROmorphone (DILAUDID) injection 1 mg (1 mg Intravenous Given 04/19/22 1216)    ED Course/ Medical Decision Making/ A&P                             Medical Decision Making Amount and/or Complexity of Data Reviewed Labs: ordered. Radiology: ordered.  Risk Prescription drug management.   This patient presents to the ED for concern of abdominal pain with nausea and vomiting and back pain, this involves an extensive number of treatment options, and is a complaint that carries with it a high risk of complications and morbidity.  The differential diagnosis includes appendicitis, cholecystitis, musculoskeletal issues, gastroenteritis, pyelonephritis, pneumonia, others   Co morbidities that complicate the patient evaluation  CKD stage III, hypertension, type II DM   Additional history obtained:  Additional history obtained from family at bedside External records from outside source obtained and reviewed including discharge summary from November 2023 when patient was admitted due to hypertensive urgency and AKI; creatinine at admission was approximately 3.5   Lab Tests:  I Ordered, and personally interpreted labs.  The pertinent results include: Creatinine 3.0 (baseline 1.8?), glucose 362, BUN 36, WBC 10.8, small hemoglobin and rare bacteria on UA, negative pregnancy test, normal lipase   Imaging Studies ordered:  I ordered imaging studies including chest x-ray and CT  abdomen pelvis without contrast I independently visualized and interpreted imaging which showed no acute findings on chest x-ray. CT shows: Hepatic steatosis.    Mild cholelithiasis without evidence of inflammation.   I agree with the radiologist interpretation   Consultations Obtained:  I requested consultation with the hospitalist, Dr. Ella Jubilee, and discussed lab and imaging findings as well as pertinent plan - they recommend: admission   Problem List / ED Course / Critical interventions / Medication management   I ordered medication including zofran and phenergan for nausea and vomiting, fentanyl and  Dilaudid for back pain, normal saline bolus for fluid resuscitation due to AKI Reevaluation of the patient after these medicines showed that the patient improved I have reviewed the patients home medicines and have made adjustments as needed   Social Determinants of Health:  Patient has Medicaid for primary insurance, has no primary care provider, endocrinologist   Test / Admission - Considered:  Patient has an acute kidney injury superimposed on her CKD stage 3. Patient needs admission for IV fluid administration         Final Clinical Impression(s) / ED Diagnoses Final diagnoses:  Acute kidney injury superimposed on chronic kidney disease  Bilateral thoracic back pain, unspecified chronicity  Generalized abdominal pain  Nausea and vomiting, unspecified vomiting type    Rx / DC Orders ED Discharge Orders     None         Pamala Duffel 04/19/22 1318    Jacalyn Lefevre, MD 04/20/22 [phone]

## 2022-04-19 NOTE — ED Triage Notes (Signed)
Pt started feeling pain in the back and some in the belly in the morning and took ozempic and it made her feel bad

## 2022-04-19 NOTE — Assessment & Plan Note (Addendum)
Positive mild DKA.  Will give basal insulin today 10 units Close follow up on BMP Continue insulin sliding scale for glucose cover and monitoring.  If worsening anion gap may need iv insulin

## 2022-04-20 ENCOUNTER — Observation Stay (HOSPITAL_COMMUNITY): Payer: Medicaid Other

## 2022-04-20 DIAGNOSIS — N319 Neuromuscular dysfunction of bladder, unspecified: Secondary | ICD-10-CM | POA: Diagnosis present

## 2022-04-20 DIAGNOSIS — K8020 Calculus of gallbladder without cholecystitis without obstruction: Secondary | ICD-10-CM | POA: Diagnosis present

## 2022-04-20 DIAGNOSIS — I160 Hypertensive urgency: Secondary | ICD-10-CM | POA: Diagnosis present

## 2022-04-20 DIAGNOSIS — E1149 Type 2 diabetes mellitus with other diabetic neurological complication: Secondary | ICD-10-CM | POA: Diagnosis present

## 2022-04-20 DIAGNOSIS — G8929 Other chronic pain: Secondary | ICD-10-CM | POA: Diagnosis present

## 2022-04-20 DIAGNOSIS — Z794 Long term (current) use of insulin: Secondary | ICD-10-CM | POA: Diagnosis not present

## 2022-04-20 DIAGNOSIS — R112 Nausea with vomiting, unspecified: Secondary | ICD-10-CM | POA: Diagnosis not present

## 2022-04-20 DIAGNOSIS — F17210 Nicotine dependence, cigarettes, uncomplicated: Secondary | ICD-10-CM | POA: Diagnosis present

## 2022-04-20 DIAGNOSIS — T383X5A Adverse effect of insulin and oral hypoglycemic [antidiabetic] drugs, initial encounter: Secondary | ICD-10-CM | POA: Diagnosis present

## 2022-04-20 DIAGNOSIS — I129 Hypertensive chronic kidney disease with stage 1 through stage 4 chronic kidney disease, or unspecified chronic kidney disease: Secondary | ICD-10-CM | POA: Diagnosis present

## 2022-04-20 DIAGNOSIS — E1165 Type 2 diabetes mellitus with hyperglycemia: Secondary | ICD-10-CM | POA: Diagnosis not present

## 2022-04-20 DIAGNOSIS — K3184 Gastroparesis: Secondary | ICD-10-CM | POA: Diagnosis present

## 2022-04-20 DIAGNOSIS — E669 Obesity, unspecified: Secondary | ICD-10-CM | POA: Diagnosis present

## 2022-04-20 DIAGNOSIS — R1084 Generalized abdominal pain: Secondary | ICD-10-CM | POA: Diagnosis present

## 2022-04-20 DIAGNOSIS — K760 Fatty (change of) liver, not elsewhere classified: Secondary | ICD-10-CM | POA: Diagnosis present

## 2022-04-20 DIAGNOSIS — L84 Corns and callosities: Secondary | ICD-10-CM | POA: Diagnosis present

## 2022-04-20 DIAGNOSIS — E1122 Type 2 diabetes mellitus with diabetic chronic kidney disease: Secondary | ICD-10-CM | POA: Diagnosis present

## 2022-04-20 DIAGNOSIS — E860 Dehydration: Secondary | ICD-10-CM | POA: Diagnosis present

## 2022-04-20 DIAGNOSIS — M546 Pain in thoracic spine: Secondary | ICD-10-CM | POA: Diagnosis present

## 2022-04-20 DIAGNOSIS — Z833 Family history of diabetes mellitus: Secondary | ICD-10-CM | POA: Diagnosis not present

## 2022-04-20 DIAGNOSIS — N189 Chronic kidney disease, unspecified: Secondary | ICD-10-CM | POA: Diagnosis not present

## 2022-04-20 DIAGNOSIS — Z7985 Long-term (current) use of injectable non-insulin antidiabetic drugs: Secondary | ICD-10-CM | POA: Diagnosis not present

## 2022-04-20 DIAGNOSIS — N1832 Chronic kidney disease, stage 3b: Secondary | ICD-10-CM | POA: Diagnosis present

## 2022-04-20 DIAGNOSIS — N179 Acute kidney failure, unspecified: Secondary | ICD-10-CM | POA: Diagnosis not present

## 2022-04-20 DIAGNOSIS — E1110 Type 2 diabetes mellitus with ketoacidosis without coma: Secondary | ICD-10-CM | POA: Diagnosis present

## 2022-04-20 DIAGNOSIS — E1143 Type 2 diabetes mellitus with diabetic autonomic (poly)neuropathy: Secondary | ICD-10-CM | POA: Diagnosis present

## 2022-04-20 DIAGNOSIS — Z6834 Body mass index (BMI) 34.0-34.9, adult: Secondary | ICD-10-CM | POA: Diagnosis not present

## 2022-04-20 DIAGNOSIS — N170 Acute kidney failure with tubular necrosis: Secondary | ICD-10-CM | POA: Diagnosis not present

## 2022-04-20 LAB — GLUCOSE, CAPILLARY
Glucose-Capillary: 357 mg/dL — ABNORMAL HIGH (ref 70–99)
Glucose-Capillary: 244 mg/dL — ABNORMAL HIGH (ref 70–99)
Glucose-Capillary: 243 mg/dL — ABNORMAL HIGH (ref 70–99)
Glucose-Capillary: 267 mg/dL — ABNORMAL HIGH (ref 70–99)

## 2022-04-20 LAB — BASIC METABOLIC PANEL
Anion gap: 9 (ref 5–15)
BUN: 40 mg/dL — ABNORMAL HIGH (ref 6–20)
CO2: 20 mmol/L — ABNORMAL LOW (ref 22–32)
Calcium: 8.6 mg/dL — ABNORMAL LOW (ref 8.9–10.3)
Chloride: 111 mmol/L (ref 98–111)
Creatinine, Ser: 3.27 mg/dL — ABNORMAL HIGH (ref 0.44–1.00)
GFR, Estimated: 18 mL/min — ABNORMAL LOW (ref >60)
Glucose, Bld: 343 mg/dL — ABNORMAL HIGH (ref 70–99)
Potassium: 4.1 mmol/L (ref 3.5–5.1)
Sodium: 140 mmol/L (ref 135–145)

## 2022-04-20 LAB — CBC
HCT: 37.3 % (ref 36.0–46.0)
Hemoglobin: 12.6 g/dL (ref 12.0–15.0)
MCH: 28.6 pg (ref 26.0–34.0)
MCHC: 33.8 g/dL (ref 30.0–36.0)
MCV: 84.8 fL (ref 80.0–100.0)
Platelets: 318 10*3/uL (ref 150–400)
RBC: 4.4 MIL/uL (ref 3.87–5.11)
RDW: 12.8 % (ref 11.5–15.5)
WBC: 11.4 10*3/uL — ABNORMAL HIGH (ref 4.0–10.5)
nRBC: 0 % (ref 0.0–0.2)

## 2022-04-20 LAB — HEMOGLOBIN A1C
Hgb A1c MFr Bld: 9.7 % — ABNORMAL HIGH (ref 4.8–5.6)
Hgb A1c MFr Bld: 9.8 % — ABNORMAL HIGH (ref 4.8–5.6)
Mean Plasma Glucose: 231.69 mg/dL
Mean Plasma Glucose: 234.56 mg/dL

## 2022-04-20 LAB — HIV ANTIBODY (ROUTINE TESTING W REFLEX): HIV Screen 4th Generation wRfx: NONREACTIVE

## 2022-04-20 MED ORDER — INSULIN GLARGINE-YFGN 100 UNIT/ML ~~LOC~~ SOLN
10.0000 [IU] | Freq: Every day | SUBCUTANEOUS | Status: DC
  Administered 2022-04-21: 10 [IU] via SUBCUTANEOUS
  Filled 2022-04-20 (×2): qty 0.1

## 2022-04-20 MED ORDER — INSULIN GLARGINE-YFGN 100 UNIT/ML ~~LOC~~ SOLN: 10.0000 [IU] | Freq: Every day | SUBCUTANEOUS | Status: DC

## 2022-04-20 MED ORDER — PROMETHAZINE HCL 25 MG/ML IJ SOLN
INTRAMUSCULAR | Status: AC
  Filled 2022-04-20: qty 1

## 2022-04-20 NOTE — TOC Progression Note (Signed)
  Transition of Care [facility]) Screening Note   Patient Details  Name: Lindsay Burnett Date of Birth: [birth year]   Transition of Care [facility]) CM/SW Contact:    Leitha Bleak, RN Phone Number: 04/20/2022, 1:16 PM  We be here the weekend, also has AKI, TOC following.   Transition of Care Department [facility]) has reviewed patient and no TOC needs have been identified at this time. We will continue to monitor patient advancement through interdisciplinary progression rounds. If new patient transition needs arise, please place a TOC consult.     Expected Discharge Plan: Home/Self Care Barriers to Discharge: Continued Medical Work up  Expected Discharge Plan and Services    Home  Social Determinants of Health (SDOH) Interventions SDOH Screenings   Depression (PHQ2-9): Low Risk  (03/03/2020)  Tobacco Use: High Risk (04/19/2022)    Readmission Risk Interventions     No data to display

## 2022-04-20 NOTE — Progress Notes (Addendum)
PROGRESS NOTE  Lindsay Burnett ZOX:[id number] DOB: 17-Mar-1990 DOA: 04/19/2022 PCP: Patient, No Pcp Per  HPI/Recap of past 24 hours: Lindsay Burnett is a 32 y.o. female with medical history significant of obesity, T2DM, CKD and hypertension induced pregnancy.   Patient has been at her usual state of health until yesterday morning, when she started experiencing nausea, vomiting and abdominal pain. Symptoms started a few hours after injecting ozempic. Since then she has not been able to eat due to nausea and vomiting, that have been constant and persistent. Her last meal was the day before yesterday.  She does not identity any improving or worsening factors, her abdominal pain has been epigastric with no radiation. Through the course of the day yesterday her chronic back pain has exacerbated now being more persistent and severe. Because persistent symptoms she presented to the ED for further evaluation.   04/20/2022: Reports persistent nausea and vomiting that continues this morning.   Assessment/Plan: Active Problems:   Intractable nausea and vomiting   Acute kidney injury superimposed on chronic kidney disease   Type 2 diabetes mellitus   Chronic back pain   Class 1 obesity  Intractable nausea and vomiting Likely side effect of home Ozempic GLP 1 agonist Continue to hold off home Ozempic. IV antiemetics as needed with parameters Encourage oral intake as tolerated.   AKI on CKD 3B likely prerenal in the setting of dehydration from poor oral intake Baseline creatinine appears to be 1.5 with GFR 44 Presented with creatinine of 3.31, downtrending to 3.27 Obtain renal ultrasound Continue to avoid nephrotoxic agents, dehydration and hypotension Continue IV fluid hydration Continue to monitor urine output   Mild none anion gap metabolic acidosis Serum bicarb 20, anion gap 9 Continue IV fluid hydration repeat renal function test in the morning   Type 2 diabetes with  hyperglycemia Obtain hemoglobin A1c Hold off oral hypoglycemics Continue insulin sliding scale.   Chronic back pain As needed analgesics.   Obesity BMI 34 Recommend weight loss outpatient with regular physical activity and healthy dieting.   Left fourth toe callus, chronic Recommend outpatient podiatry follow-up      Advance Care Planning:   Code Status: Full Code    Consults: None.   Family Communication: None at bedside.      Objective: Vitals:   04/19/22 1718 04/19/22 2019 04/20/22 0137 04/20/22 0257  BP: (!) 175/97 (!) 188/111 (!) 178/98 (!) 151/98  Pulse: (!) 103 (!) 110 (!) 111   Resp: 20 20 20 20   Temp: 98.5 F (36.9 C) 98.6 F (37 C) 98.8 F (37.1 C)   TempSrc: Oral Oral Oral   SpO2: 100% 100% 100% 100%  Weight:      Height:        Intake/Output Summary (Last 24 hours) at 04/20/2022 1311 Last data filed at 04/20/2022 8119 Gross per 24 hour  Intake 828.1 ml  Output --  Net 828.1 ml   Filed Weights   04/19/22 1505  Weight: 104.3 kg    Exam:  General: 32 y.o. year-old female well developed well nourished in no acute distress.  Alert and oriented x3. Cardiovascular: Regular rate and rhythm with no rubs or gallops.  No thyromegaly or JVD noted.   Respiratory: Clear to auscultation with no wheezes or rales. Good inspiratory effort. Abdomen: Soft nontender nondistended with normal bowel sounds x4 quadrants. Musculoskeletal: No lower extremity edema. 2/4 pulses in all 4 extremities. Skin: No ulcerative lesions noted or rashes, Psychiatry: Mood is appropriate for condition  and setting   Data Reviewed: CBC: Recent Labs  Lab 04/19/22 0943 04/19/22 1013 04/20/22 0555  WBC 10.8*  --  11.4*  NEUTROABS 9.2*  --   --   HGB 12.7 12.6 12.6  HCT 36.3 37.0 37.3  MCV 82.3  --  84.8  PLT 339  --  318   Basic Metabolic Panel: Recent Labs  Lab 04/19/22 0943 04/19/22 1013 04/19/22 1828 04/19/22 2209 04/20/22 0555  NA 136 140 140 142 140  K 4.2 4.4  4.2 4.4 4.1  CL 105 109 107 108 111  CO2 17*  --  18* 20* 20*  GLUCOSE 362* 377* 390* 369* 343*  BUN 36* 34* 36* 38* 40*  CREATININE 3.00* 3.30* 3.14* 3.31* 3.27*  CALCIUM 9.2  --  9.0 9.1 8.6*   GFR: Estimated Creatinine Clearance: 30.9 mL/min (A) (by C-G formula based on SCr of 3.27 mg/dL (H)). Liver Function Tests: Recent Labs  Lab 04/19/22 0943  AST 17  ALT 21  ALKPHOS 212*  BILITOT 0.9  PROT 8.5*  ALBUMIN 4.1   Recent Labs  Lab 04/19/22 0943  LIPASE 51   No results for input(s): "AMMONIA" in the last 168 hours. Coagulation Profile: No results for input(s): "INR", "PROTIME" in the last 168 hours. Cardiac Enzymes: No results for input(s): "CKTOTAL", "CKMB", "CKMBINDEX", "TROPONINI" in the last 168 hours. BNP (last 3 results) No results for input(s): "PROBNP" in the last 8760 hours. HbA1C: No results for input(s): "HGBA1C" in the last 72 hours. CBG: Recent Labs  Lab 04/19/22 1706 04/19/22 2021 04/20/22 0746 04/20/22 1121  GLUCAP 372* 354* 357* 244*   Lipid Profile: No results for input(s): "CHOL", "HDL", "LDLCALC", "TRIG", "CHOLHDL", "LDLDIRECT" in the last 72 hours. Thyroid Function Tests: No results for input(s): "TSH", "T4TOTAL", "FREET4", "T3FREE", "THYROIDAB" in the last 72 hours. Anemia Panel: No results for input(s): "VITAMINB12", "FOLATE", "FERRITIN", "TIBC", "IRON", "RETICCTPCT" in the last 72 hours. Urine analysis:    Component Value Date/Time   COLORURINE YELLOW 04/19/2022 1126   APPEARANCEUR HAZY (A) 04/19/2022 1126   LABSPEC 1.012 04/19/2022 1126   PHURINE 6.0 04/19/2022 1126   GLUCOSEU >=500 (A) 04/19/2022 1126   HGBUR SMALL (A) 04/19/2022 1126   BILIRUBINUR NEGATIVE 04/19/2022 1126   BILIRUBINUR Neg 07/17/2020 1555   KETONESUR 5 (A) 04/19/2022 1126   PROTEINUR >=300 (A) 04/19/2022 1126   UROBILINOGEN 0.2 07/17/2020 1555   NITRITE NEGATIVE 04/19/2022 1126   LEUKOCYTESUR NEGATIVE 04/19/2022 1126   Sepsis  Labs: @LABRCNTIP (procalcitonin:4,lacticidven:4)  )No results found for this or any previous visit (from the past 240 hour(s)).    Studies: No results found.  Scheduled Meds:  enoxaparin (LOVENOX) injection  40 mg Subcutaneous Q24H   insulin aspart  0-15 Units Subcutaneous TID WC   insulin glargine-yfgn  10 Units Subcutaneous Daily   pantoprazole (PROTONIX) IV  40 mg Intravenous Q24H    Continuous Infusions:  lactated ringers 100 mL/hr at 04/20/22 1025   promethazine (PHENERGAN) injection (IM or IVPB) 12.5 mg (04/20/22 0522)     LOS: 0 days     Darlin Drop, MD [facility] Pager [phone]  If 7PM-7AM, please contact night-coverage [email or web address] Password [email or web address] 04/20/2022, 1:11 PM

## 2022-04-20 NOTE — Progress Notes (Signed)
Advised that pt was upset and wanted to talk with charge nurse. To pt's room to find pt sitting up on bed, rocking back and forth. Introduced myself and asked if she would tell me why she was upset. Pt stated she couldn't "get any service". Asked pt to elaborate and she described delays in having requests/needs addressed, was upset that staff would not give her water while she was vomiting, and that she was still in pain.   Expressed empathy to pt regarding these issues and her frustration. I then addressed each issue individually to patient's stated understanding and satisfaction.   Provided pt education her current HgA1c level and on consequences of prolonged elevated blood glucose and hypertension, including kidney damage, gastroparesis & circulation issues (as pt has sore on left 3rd toe, skin is dark and flaky and painful to touch - MD Margo Aye aware). Pt states that she takes no meds for diabetes or HTN because she has no doctor. Advised pt that she would be provided a list of providers to follow up with at discharge.  Pt noted to be gulping cups of liquids then immediately vomiting it back up. Advised pt to sip on fluids and eat ice chips to prevent from overloading stomach and causing vomiting. Pt agreeable to trying sips instead of gulping. Pt agreed to take IV zofran for nausea and oral narcotic for continued back pain.

## 2022-04-20 NOTE — Progress Notes (Signed)
Patient c/o nausea and pain throughout the night PRN's for pain and nausea  rotated with short-lived effectiveness. Patient ambulates to and from  room bathroom independently.

## 2022-04-21 DIAGNOSIS — E1165 Type 2 diabetes mellitus with hyperglycemia: Secondary | ICD-10-CM | POA: Diagnosis not present

## 2022-04-21 DIAGNOSIS — R112 Nausea with vomiting, unspecified: Secondary | ICD-10-CM | POA: Diagnosis not present

## 2022-04-21 LAB — GLUCOSE, CAPILLARY
Glucose-Capillary: 234 mg/dL — ABNORMAL HIGH (ref 70–99)
Glucose-Capillary: 250 mg/dL — ABNORMAL HIGH (ref 70–99)
Glucose-Capillary: 205 mg/dL — ABNORMAL HIGH (ref 70–99)
Glucose-Capillary: 252 mg/dL — ABNORMAL HIGH (ref 70–99)

## 2022-04-21 LAB — CBC
HCT: 41 % (ref 36.0–46.0)
Hemoglobin: 13.8 g/dL (ref 12.0–15.0)
MCH: 28.8 pg (ref 26.0–34.0)
MCHC: 33.7 g/dL (ref 30.0–36.0)
MCV: 85.6 fL (ref 80.0–100.0)
Platelets: 330 10*3/uL (ref 150–400)
RBC: 4.79 MIL/uL (ref 3.87–5.11)
RDW: 12.4 % (ref 11.5–15.5)
WBC: 12.8 10*3/uL — ABNORMAL HIGH (ref 4.0–10.5)
nRBC: 0 % (ref 0.0–0.2)

## 2022-04-21 LAB — BASIC METABOLIC PANEL
Anion gap: 10 (ref 5–15)
BUN: 37 mg/dL — ABNORMAL HIGH (ref 6–20)
CO2: 21 mmol/L — ABNORMAL LOW (ref 22–32)
Calcium: 8.6 mg/dL — ABNORMAL LOW (ref 8.9–10.3)
Chloride: 109 mmol/L (ref 98–111)
Creatinine, Ser: 3.33 mg/dL — ABNORMAL HIGH (ref 0.44–1.00)
GFR, Estimated: 18 mL/min — ABNORMAL LOW (ref >60)
Glucose, Bld: 245 mg/dL — ABNORMAL HIGH (ref 70–99)
Potassium: 3.7 mmol/L (ref 3.5–5.1)
Sodium: 140 mmol/L (ref 135–145)

## 2022-04-21 LAB — MAGNESIUM: Magnesium: 1.9 mg/dL (ref 1.7–2.4)

## 2022-04-21 LAB — PHOSPHORUS: Phosphorus: 3 mg/dL (ref 2.5–4.6)

## 2022-04-21 MED ORDER — INSULIN GLARGINE-YFGN 100 UNIT/ML ~~LOC~~ SOLN
5.0000 [IU] | Freq: Once | SUBCUTANEOUS | Status: AC
  Administered 2022-04-21: 5 [IU] via SUBCUTANEOUS
  Filled 2022-04-21: qty 0.05

## 2022-04-21 MED ORDER — PROMETHAZINE HCL 25 MG/ML IJ SOLN
12.5000 mg | INTRAMUSCULAR | Status: AC
  Administered 2022-04-21: 12.5 mg via INTRAMUSCULAR
  Filled 2022-04-21: qty 1

## 2022-04-21 MED ORDER — HYDROMORPHONE HCL 1 MG/ML IJ SOLN
0.5000 mg | INTRAMUSCULAR | Status: DC | PRN
  Administered 2022-04-21 – 2022-04-22 (×3): 0.5 mg via INTRAVENOUS
  Filled 2022-04-21 (×3): qty 0.5

## 2022-04-21 MED ORDER — SODIUM CHLORIDE 0.9 % IV SOLN
250.0000 mg | Freq: Four times a day (QID) | INTRAVENOUS | Status: DC
  Administered 2022-04-21 – 2022-04-23 (×7): 250 mg via INTRAVENOUS
  Filled 2022-04-21 (×10): qty 5

## 2022-04-21 MED ORDER — INSULIN GLARGINE-YFGN 100 UNIT/ML ~~LOC~~ SOLN
15.0000 [IU] | Freq: Every day | SUBCUTANEOUS | Status: DC
  Administered 2022-04-22: 15 [IU] via SUBCUTANEOUS
  Filled 2022-04-21 (×2): qty 0.15

## 2022-04-21 MED ORDER — PROMETHAZINE HCL 25 MG/ML IJ SOLN
INTRAMUSCULAR | Status: AC
  Filled 2022-04-21: qty 1

## 2022-04-21 MED ORDER — SODIUM CHLORIDE 0.9 % IV SOLN: 12.5000 mg | Freq: Three times a day (TID) | INTRAVENOUS | Status: DC | PRN

## 2022-04-21 MED ORDER — AMLODIPINE BESYLATE 5 MG PO TABS
10.0000 mg | ORAL_TABLET | Freq: Every day | ORAL | Status: DC
  Administered 2022-04-22 – 2022-04-23 (×2): 10 mg via ORAL
  Filled 2022-04-21 (×3): qty 2

## 2022-04-21 MED ORDER — CLONIDINE HCL 0.1 MG/24HR TD PTWK
0.1000 mg | MEDICATED_PATCH | TRANSDERMAL | Status: DC
  Administered 2022-04-21: 0.1 mg via TRANSDERMAL
  Filled 2022-04-21: qty 1

## 2022-04-21 MED ORDER — HYDRALAZINE HCL 20 MG/ML IJ SOLN
5.0000 mg | Freq: Four times a day (QID) | INTRAMUSCULAR | Status: DC | PRN
  Administered 2022-04-21 – 2022-04-22 (×2): 5 mg via INTRAVENOUS
  Filled 2022-04-21 (×3): qty 1

## 2022-04-21 MED ORDER — METOPROLOL TARTRATE 5 MG/5ML IV SOLN
5.0000 mg | Freq: Once | INTRAVENOUS | Status: AC
  Administered 2022-04-21: 5 mg via INTRAVENOUS
  Filled 2022-04-21: qty 5

## 2022-04-21 MED ORDER — DILTIAZEM HCL 25 MG/5ML IV SOLN
20.0000 mg | Freq: Once | INTRAVENOUS | Status: AC
  Administered 2022-04-21: 20 mg via INTRAVENOUS
  Filled 2022-04-21: qty 5

## 2022-04-21 NOTE — Progress Notes (Addendum)
PROGRESS NOTE  Cypress Hinkson ZOX:[id number] DOB: 10/15/1990 DOA: 04/19/2022 PCP: Patient, No Pcp Per  HPI/Recap of past 24 hours: Lindsay Burnett is a 32 y.o. female with medical history significant of obesity, T2DM, CKD3B and hypertension who presented to [facility], ED with intractable nausea and vomiting after restarting Ozempic.  States Lindsay Burnett had been on it for several months in the past, stopped for 2 months then restarted on Thursday the day prior to her presentation.  After restarting Ozempic started having nausea and vomiting.  Since then, her chronic back pain has exacerbated now being more persistent and severe.  Workup revealed AKI on CKD 3B and intractable nausea and vomiting for which GI was consulted on 04/21/2022.  Also reports left fourth toe callus, Lindsay Burnett requested evaluation, Ortho, Dr. Romeo Apple consulted.  04/21/2022: The patient was seen and examined at bedside.  Reports persistent nausea and vomiting.  States Lindsay Burnett is too weak to get out of bed and wants staff to assist with mobilization.  PT OT consult placed.  Assessment/Plan: Principal Problem:   Intractable nausea and vomiting Active Problems:   Acute kidney injury superimposed on chronic kidney disease   Type 2 diabetes mellitus   Chronic back pain   Class 1 obesity  Intractable nausea and vomiting suspect side effect from Ozempic Likely side effect of home Ozempic GLP 1 agonist.  Symptoms started after Lindsay Burnett took the medication. Continue to hold off home Ozempic. IV antiemetics as needed. GI consulted. On clear liquid diet, advance as tolerated.   AKI on CKD 3B likely prerenal in the setting of dehydration from poor oral intake Baseline creatinine appears to be 1.5 with GFR 44 Presented with creatinine of 3.31 Creatinine is uptrending, renal ultrasound was nonacute. Continue to avoid nephrotoxic agents, dehydration and hypotension Continue IV fluid hydration Continue to monitor urine output  Start strict I's and  O's. Nephrology consulted  Mild non anion gap metabolic acidosis Serum bicarb 20, anion gap 9, serum bicarb is uptrending 21 Continue IV fluid hydration    Type 2 diabetes with hyperglycemia Hemoglobin A1c 9.8 on 04/20/2022. Continue insulin sliding scale. Diabetes coordinator consulted for patient education  Hypertension, BP is not at goal, elevated Added Norvasc 10 mg daily IV hydralazine as needed with parameters. Continue to closely monitor vital signs   Chronic back pain As needed analgesics.   Obesity BMI 34 Recommend weight loss outpatient with regular physical activity and healthy dieting.   Left fourth toe callus, chronic Consulted orthopedic surgery, per patient's request, Dr. Romeo Apple on 04/21/2022 via paging system.    Advance Care Planning:   Code Status: Full Code    Consults: Orthopedic surgery, nephrology, GI.   Family Communication: None at bedside.      Objective: Vitals:   04/21/22 0331 04/21/22 0500 04/21/22 0618 04/21/22 0900  BP: (!) 165/110 (!) 178/113 (!) 183/101 (!) 180/110  Pulse: 98 (!) 108 92 100  Resp: 18 18    Temp: 98.2 F (36.8 C) 98.6 F (37 C)    TempSrc: Oral Oral    SpO2: 100% 100% 97%   Weight:      Height:        Intake/Output Summary (Last 24 hours) at 04/21/2022 1121 Last data filed at 04/20/2022 1611 Gross per 24 hour  Intake 900 ml  Output 600 ml  Net 300 ml   Filed Weights   04/19/22 1505  Weight: 104.3 kg    Exam:  General: 32 y.o. year-old female well-developed and in no  acute distress.  Lindsay Burnett is alert oriented x 3. Cardiovascular: Regular rate and rhythm no rubs or gallops. Respiratory: Clear to auscultation no wheezes or rales. Abdomen: Soft nontender normal bowel sounds present. Musculoskeletal: No lower extremity edema bilaterally. Skin: No ulcerative lesions noted or rashes, Psychiatry: Mood is appropriate for condition setting.   Data Reviewed: CBC: Recent Labs  Lab 04/19/22 0943 04/19/22 1013  04/20/22 0555 04/21/22 0404  WBC 10.8*  --  11.4* 12.8*  NEUTROABS 9.2*  --   --   --   HGB 12.7 12.6 12.6 13.8  HCT 36.3 37.0 37.3 41.0  MCV 82.3  --  84.8 85.6  PLT 339  --  318 330   Basic Metabolic Panel: Recent Labs  Lab 04/19/22 0943 04/19/22 1013 04/19/22 1828 04/19/22 2209 04/20/22 0555 04/21/22 0404  NA 136 140 140 142 140 140  K 4.2 4.4 4.2 4.4 4.1 3.7  CL 105 109 107 108 111 109  CO2 17*  --  18* 20* 20* 21*  GLUCOSE 362* 377* 390* 369* 343* 245*  BUN 36* 34* 36* 38* 40* 37*  CREATININE 3.00* 3.30* 3.14* 3.31* 3.27* 3.33*  CALCIUM 9.2  --  9.0 9.1 8.6* 8.6*  MG  --   --   --   --   --  1.9  PHOS  --   --   --   --   --  3.0   GFR: Estimated Creatinine Clearance: 30.4 mL/min (A) (by C-G formula based on SCr of 3.33 mg/dL (H)). Liver Function Tests: Recent Labs  Lab 04/19/22 0943  AST 17  ALT 21  ALKPHOS 212*  BILITOT 0.9  PROT 8.5*  ALBUMIN 4.1   Recent Labs  Lab 04/19/22 0943  LIPASE 51   No results for input(s): "AMMONIA" in the last 168 hours. Coagulation Profile: No results for input(s): "INR", "PROTIME" in the last 168 hours. Cardiac Enzymes: No results for input(s): "CKTOTAL", "CKMB", "CKMBINDEX", "TROPONINI" in the last 168 hours. BNP (last 3 results) No results for input(s): "PROBNP" in the last 8760 hours. HbA1C: Recent Labs    04/19/22 0943 04/20/22 0555  HGBA1C 9.7* 9.8*   CBG: Recent Labs  Lab 04/20/22 0746 04/20/22 1121 04/20/22 1647 04/20/22 2102 04/21/22 0742  GLUCAP 357* 244* 243* 267* 234*   Lipid Profile: No results for input(s): "CHOL", "HDL", "LDLCALC", "TRIG", "CHOLHDL", "LDLDIRECT" in the last 72 hours. Thyroid Function Tests: No results for input(s): "TSH", "T4TOTAL", "FREET4", "T3FREE", "THYROIDAB" in the last 72 hours. Anemia Panel: No results for input(s): "VITAMINB12", "FOLATE", "FERRITIN", "TIBC", "IRON", "RETICCTPCT" in the last 72 hours. Urine analysis:    Component Value Date/Time   COLORURINE  YELLOW 04/19/2022 1126   APPEARANCEUR HAZY (A) 04/19/2022 1126   LABSPEC 1.012 04/19/2022 1126   PHURINE 6.0 04/19/2022 1126   GLUCOSEU >=500 (A) 04/19/2022 1126   HGBUR SMALL (A) 04/19/2022 1126   BILIRUBINUR NEGATIVE 04/19/2022 1126   BILIRUBINUR Neg 07/17/2020 1555   KETONESUR 5 (A) 04/19/2022 1126   PROTEINUR >=300 (A) 04/19/2022 1126   UROBILINOGEN 0.2 07/17/2020 1555   NITRITE NEGATIVE 04/19/2022 1126   LEUKOCYTESUR NEGATIVE 04/19/2022 1126   Sepsis Labs: (procalcitonin:4,lacticidven:4)  )No results found for this or any previous visit (from the past 240 hour(s)).    Studies: US RENAL  Result Date: 04/20/2022 CLINICAL DATA:  Acute kidney injury EXAM: RENAL / URINARY TRACT ULTRASOUND COMPLETE COMPARISON:  Noncontrast CT yesterday FINDINGS: Right Kidney: Renal measurements: 9.3 x 4.6 x 5.4 cm = volume: 121  mL. Normal parenchymal echogenicity. No hydronephrosis. No visualized stone or focal lesion. Left Kidney: Renal measurements: 10.5 x 5.1 x 4.5 cm = volume: 128 mL. Normal parenchymal echogenicity. No hydronephrosis. No visualized stone or focal lesion. Bladder: Moderately distended, volume = 530 cm^3. Appears normal for degree of bladder distention. Other: Incidental increased hepatic echogenicity typical of steatosis. IMPRESSION: 1. Normal sonographic appearance of the kidneys. 2. Moderately distended urinary bladder, normal for degree of distension. Electronically Signed   By: Narda Rutherford M.D.   On: 04/20/2022 16:05    Scheduled Meds:  amLODipine  10 mg Oral Daily   enoxaparin (LOVENOX) injection  40 mg Subcutaneous Q24H   insulin aspart  0-15 Units Subcutaneous TID WC   insulin glargine-yfgn  10 Units Subcutaneous Daily   pantoprazole (PROTONIX) IV  40 mg Intravenous Q24H    Continuous Infusions:  erythromycin     lactated ringers 100 mL/hr at 04/20/22 2142   promethazine (PHENERGAN) injection (IM or IVPB) 12.5 mg (04/21/22 0503)     LOS: 1 day      Darlin Drop, MD [facility] Pager [phone]  If 7PM-7AM, please contact night-coverage [email or web address] Password [facility] 04/21/2022, 11:21 AM

## 2022-04-21 NOTE — Progress Notes (Signed)
Pt lost iv access on night shift. This nurse and several other nurses have been trying to get a PIV on pt. Pt has been currently stuck 6 times and unsuccessful. This nurse called ICU and ED to get a ultrasound guided and no one is able to do it. Notified DO that she may benefit from a midline. Patient is wanted IV phenergan every 6 hours, receiving LR contin and Erythromycin IVP scheduled. Currently still w/o a IV notified DO.

## 2022-04-21 NOTE — Consult Note (Signed)
Katrinka Blazing, M.D. Gastroenterology & Hepatology                                           Patient Name: Lindsay Burnett Account #: @   MRN: [id number] Admission Date: 04/19/2022 Date of Evaluation:  04/21/2022 Time of Evaluation: 8:16 AM   Referring Physician: Dow Adolph, DO  Chief Complaint: Nausea and vomiting, abdominal pain  HPI:  This is a 32 y.o. female with history of uncontrolled diabetes complicated by CKD, who comes to the hospital after presenting recurrent episodes of nausea, vomiting and abdominal pain.  Patient reports that she restarted using Ozempic on Thursday night.  States that since Friday she presented recurrent episodes of nausea and vomiting multiple times with poor oral intake.  Also reported having diffuse abdominal pain described as cramping.  No melena or hematochezia, fever, chills, abdominal distention.  She reports that she was taking Ozempic in the past many months ago and did not have this reaction but had to stop for several months.  Diagnosed with diabetes since 2004 or 2006, does not remember exactly.  Never had EGD or colonoscopy.  Notably, the patient reports having reactions to Reglan and Compazine as she developed strokelike symptoms -she describes having involuntary hand movements.  Did not tolerate these medications.  Labs today showed CMP with creatinine of 3.33 and BUN of 37 (at baseline), normal electrolytes, glucose was 245 (admission glucose was 362), WBC 12.8, rest of cell lines within normal limits. notably, her anion gap was borderline at 15 on admission.    Initial lipase was 51 2 days ago.  Hemoglobin A1c was 9.8 on admission.  CT of the abdomen and pelvis without contrast showed hepatic steatosis with mild cholelithiasis without inflammation.  Past Medical History: SEE CHRONIC ISSSUES: Past Medical History:  Diagnosis Date   Blood transfusion without reported diagnosis    Diabetes mellitus without complication     Pregnancy induced hypertension    Past Surgical History:  Past Surgical History:  Procedure Laterality Date   CESAREAN SECTION     2 cesarean sections   CESAREAN SECTION N/A 08/12/2020   Procedure: CESAREAN SECTION, ADD LILETTA IUD;  Surgeon: Tereso Newcomer, MD;  Location: [facility] LD ORS;  Service: Obstetrics;  Laterality: N/A;   Family History:  Family History  Problem Relation Age of Onset   Diabetes Mother    Diabetes Maternal Grandmother    Social History:  Social History   Tobacco Use   Smoking status: Every Day    Packs/day: 0.50    Years: 4.00    Additional pack years: 0.00    Total pack years: 2.00    Types: Cigarettes   Smokeless tobacco: Never  Vaping Use   Vaping Use: Never used  Substance Use Topics   Alcohol use: Not Currently    Comment: occ   Drug use: Never    Home Medications:  Prior to Admission medications   Medication Sig Start Date End Date Taking? Authorizing Provider  insulin glargine (LANTUS SOLOSTAR) 100 UNIT/ML Solostar Pen Inject 10 Units into the skin at bedtime. 08/15/20  Yes Anyanwu, Jethro Bastos, MD  insulin lispro (HUMALOG) 100 UNIT/ML KwikPen Inject 5-8 Units into the skin 3 (three) times daily with meals. 01/12/19  Yes [provider]  OZEMPIC, 1 MG/DOSE, 4 MG/3ML SOPN Inject 1 mg into the skin once a week.  Yes [provider]    Inpatient Medications:  Current Facility-Administered Medications:    acetaminophen (TYLENOL) tablet 650 mg, 650 mg, Oral, Q6H PRN, 650 mg at 04/21/22 0501 **OR** acetaminophen (TYLENOL) suppository 650 mg, 650 mg, Rectal, Q6H PRN, Arrien, York Ram, MD   enoxaparin (LOVENOX) injection 40 mg, 40 mg, Subcutaneous, Q24H, Arrien, York Ram, MD, 40 mg at 04/20/22 2139   insulin aspart (novoLOG) injection 0-15 Units, 0-15 Units, Subcutaneous, TID WC, Arrien, York Ram, MD, 5 Units at 04/20/22 1759   insulin glargine-yfgn (SEMGLEE) injection 10 Units, 10 Units, Subcutaneous, Daily, Hall,  Carole N, DO   lactated ringers infusion, , Intravenous, Continuous, Arrien, York Ram, MD, Last Rate: 100 mL/hr at 04/20/22 2142, New Bag at 04/20/22 2142   morphine (PF) 2 MG/ML injection 1 mg, 1 mg, Intravenous, Q4H PRN, Arrien, York Ram, MD, 1 mg at 04/21/22 0258   ondansetron (ZOFRAN) tablet 4 mg, 4 mg, Oral, Q6H PRN **OR** ondansetron (ZOFRAN) injection 4 mg, 4 mg, Intravenous, Q6H PRN, Arrien, York Ram, MD, 4 mg at 04/21/22 0258   oxyCODONE (Oxy IR/ROXICODONE) immediate release tablet 5 mg, 5 mg, Oral, Q4H PRN, Arrien, York Ram, MD, 5 mg at 04/21/22 0501   pantoprazole (PROTONIX) injection 40 mg, 40 mg, Intravenous, Q24H, Arrien, York Ram, MD, 40 mg at 04/20/22 1946   promethazine (PHENERGAN) 12.5 mg in sodium chloride 0.9 % 50 mL IVPB, 12.5 mg, Intravenous, Q6H PRN, McCauley, Larry B, PA-C, Last Rate: 200 mL/hr at 04/21/22 0503, 12.5 mg at 04/21/22 0503 Allergies: Compazine [prochlorperazine edisylate] and Reglan [metoclopramide]  Complete Review of Systems: GENERAL: negative for malaise, night sweats HEENT: No changes in hearing or vision, no nose bleeds or other nasal problems. NECK: Negative for lumps, goiter, pain and significant neck swelling RESPIRATORY: Negative for cough, wheezing CARDIOVASCULAR: Negative for chest pain, leg swelling, palpitations, orthopnea GI: SEE HPI MUSCULOSKELETAL: Negative for joint pain or swelling, back pain, and muscle pain. SKIN: Negative for lesions, rash PSYCH: Negative for sleep disturbance, mood disorder and recent psychosocial stressors. HEMATOLOGY Negative for prolonged bleeding, bruising easily, and swollen nodes. ENDOCRINE: Negative for cold or heat intolerance, polyuria, polydipsia and goiter. NEURO: negative for tremor, gait imbalance, syncope and seizures. The remainder of the review of systems is noncontributory.  Physical Exam: BP (!) 183/101   Pulse 92   Temp 98.6 F (37 C) (Oral)   Resp 18   Ht   (1.727 m)   Wt 104.3 kg   LMP 04/15/2022   SpO2 97%   BMI 34.97 kg/m  GENERAL: The patient is AO x3, in no acute distress. Obese. HEENT: Head is normocephalic and atraumatic. EOMI are intact. Mouth is well hydrated and without lesions. NECK: Supple. No masses LUNGS: Clear to auscultation. No presence of rhonchi/wheezing/rales. Adequate chest expansion HEART: RRR, normal s1 and s2. ABDOMEN: tender to palpation diffusely but worse in epigastric and mid abdominal areas, no guarding, no peritoneal signs, and nondistended. BS +. No masses. EXTREMITIES: Without any cyanosis, clubbing, rash, lesions or edema. NEUROLOGIC: AOx3, no focal motor deficit. SKIN: no jaundice, no rashes  Laboratory Data CBC:     Component Value Date/Time   WBC 12.8 (H) 04/21/2022 0404   RBC 4.79 04/21/2022 0404   HGB 13.8 04/21/2022 0404   HGB 11.8 08/09/2020 1007   HCT 41.0 04/21/2022 0404   HCT 33.2 (L) 08/09/2020 1007   PLT 330 04/21/2022 0404   PLT 220 08/09/2020 1007   MCV 85.6 04/21/2022 0404   MCV  83 08/09/2020 1007   MCH 28.8 04/21/2022 0404   MCHC 33.7 04/21/2022 0404   RDW 12.4 04/21/2022 0404   RDW 13.1 08/09/2020 1007   LYMPHSABS 1.1 04/19/2022 0943   LYMPHSABS 2.4 03/03/2020 1033   MONOABS 0.4 04/19/2022 0943   EOSABS 0.0 04/19/2022 0943   EOSABS 0.1 03/03/2020 1033   BASOSABS 0.0 04/19/2022 0943   BASOSABS 0.1 03/03/2020 1033   COAG: No results found for: "INR", "PROTIME"  BMP:     Latest Ref Rng & Units 04/20/2022    5:55 AM 04/19/2022   10:09 PM 04/19/2022    6:28 PM  BMP  Glucose 70 - 99 mg/dL 976  734  193   BUN 6 - 20 mg/dL 40  38  36   Creatinine 0.44 - 1.00 mg/dL 7.90  2.40  9.73   Sodium 135 - 145 mmol/L 140  142  140   Potassium 3.5 - 5.1 mmol/L 4.1  4.4  4.2   Chloride 98 - 111 mmol/L 111  108  107   CO2 22 - 32 mmol/L 20  20  18    Calcium 8.9 - 10.3 mg/dL 8.6  9.1  9.0     HEPATIC:     Latest Ref Rng & Units 04/19/2022    9:43 AM 08/15/2020   10:55 AM 08/14/2020     6:30 AM  Hepatic Function  Total Protein 6.5 - 8.1 g/dL 8.5  5.1  4.8   Albumin 3.5 - 5.0 g/dL 4.1  1.8  1.7   AST 15 - 41 U/L 17  15  12    ALT 0 - 44 U/L 21  12  10    Alk Phosphatase 38 - 126 U/L 212  93  92   Total Bilirubin 0.3 - 1.2 mg/dL 0.9  0.2  0.5     CARDIAC: No results found for: "CKTOTAL", "CKMB", "CKMBINDEX", "TROPONINI"   Imaging: I personally reviewed and interpreted the available imaging.  Assessment & Plan: Lindsay Burnett is a 32 y.o. female with history of uncontrolled diabetes complicated by CKD, who comes to the hospital after presenting recurrent episodes of nausea, vomiting and abdominal pain.  Patient has presented acute onset of symptoms after restarting Ozempic.  It is very possible that the combination of poorly controlled diabetes (patient has had elevated hemoglobin A1c) and use of GLP-1 agonist such as Ozempic has led to her current presentation and possible gastrointestinal sensitivity.  At the moment we will recommend discontinuing Ozempic or other GLP-1 agonists.  As she has had intolerance to medications such as Reglan or Compazine in the past, her current options are very limited.  Zofran may cause for gastroparesis.  Due to this, we will try to manage her symptoms with erythromycin while hospitalized.  She will need to be kept on clear liquid diet for now.  It is extremely important to achieve adequate glycemic control as this may worsen her symptoms even more.  -Clear liquid diet -Tight glycemic control -Minimize opioid use -Continue Phenergan 12.5 mg every 6 hours as needed -Erythromycin to 50 mg every 6 hours IV -Avoid using Ozempic or other GLP-1 agonists -If symptoms persist, may consider performing an inpatient upper endoscopy  Katrinka Blazing, MD Gastroenterology and Hepatology [facility]

## 2022-04-22 ENCOUNTER — Other Ambulatory Visit (HOSPITAL_COMMUNITY): Payer: Self-pay

## 2022-04-22 DIAGNOSIS — R1084 Generalized abdominal pain: Secondary | ICD-10-CM

## 2022-04-22 DIAGNOSIS — M546 Pain in thoracic spine: Secondary | ICD-10-CM

## 2022-04-22 DIAGNOSIS — N179 Acute kidney failure, unspecified: Secondary | ICD-10-CM | POA: Diagnosis not present

## 2022-04-22 DIAGNOSIS — N189 Chronic kidney disease, unspecified: Secondary | ICD-10-CM | POA: Diagnosis not present

## 2022-04-22 DIAGNOSIS — R112 Nausea with vomiting, unspecified: Secondary | ICD-10-CM | POA: Diagnosis not present

## 2022-04-22 LAB — COMPREHENSIVE METABOLIC PANEL
ALT: 54 U/L — ABNORMAL HIGH (ref 0–44)
AST: 63 U/L — ABNORMAL HIGH (ref 15–41)
Albumin: 3.2 g/dL — ABNORMAL LOW (ref 3.5–5.0)
Alkaline Phosphatase: 176 U/L — ABNORMAL HIGH (ref 38–126)
Anion gap: 9 (ref 5–15)
BUN: 36 mg/dL — ABNORMAL HIGH (ref 6–20)
CO2: 22 mmol/L (ref 22–32)
Calcium: 8.2 mg/dL — ABNORMAL LOW (ref 8.9–10.3)
Chloride: 109 mmol/L (ref 98–111)
Creatinine, Ser: 3.6 mg/dL — ABNORMAL HIGH (ref 0.44–1.00)
GFR, Estimated: 16 mL/min — ABNORMAL LOW (ref >60)
Glucose, Bld: 201 mg/dL — ABNORMAL HIGH (ref 70–99)
Potassium: 4 mmol/L (ref 3.5–5.1)
Sodium: 140 mmol/L (ref 135–145)
Total Bilirubin: 0.3 mg/dL (ref 0.3–1.2)
Total Protein: 6.9 g/dL (ref 6.5–8.1)

## 2022-04-22 LAB — CBC
HCT: 40.3 % (ref 36.0–46.0)
Hemoglobin: 13.1 g/dL (ref 12.0–15.0)
MCH: 28.5 pg (ref 26.0–34.0)
MCHC: 32.5 g/dL (ref 30.0–36.0)
MCV: 87.8 fL (ref 80.0–100.0)
Platelets: 283 10*3/uL (ref 150–400)
RBC: 4.59 MIL/uL (ref 3.87–5.11)
RDW: 12.3 % (ref 11.5–15.5)
WBC: 9.1 10*3/uL (ref 4.0–10.5)
nRBC: 0 % (ref 0.0–0.2)

## 2022-04-22 LAB — GLUCOSE, CAPILLARY
Glucose-Capillary: 209 mg/dL — ABNORMAL HIGH (ref 70–99)
Glucose-Capillary: 392 mg/dL — ABNORMAL HIGH (ref 70–99)
Glucose-Capillary: 181 mg/dL — ABNORMAL HIGH (ref 70–99)
Glucose-Capillary: 206 mg/dL — ABNORMAL HIGH (ref 70–99)

## 2022-04-22 LAB — MAGNESIUM: Magnesium: 2 mg/dL (ref 1.7–2.4)

## 2022-04-22 LAB — PHOSPHORUS: Phosphorus: 3.1 mg/dL (ref 2.5–4.6)

## 2022-04-22 MED ORDER — INSULIN GLARGINE-YFGN 100 UNIT/ML ~~LOC~~ SOLN
18.0000 [IU] | Freq: Every day | SUBCUTANEOUS | Status: DC
  Administered 2022-04-23: 18 [IU] via SUBCUTANEOUS
  Filled 2022-04-22 (×2): qty 0.18

## 2022-04-22 MED ORDER — ENOXAPARIN SODIUM 30 MG/0.3ML IJ SOSY
30.0000 mg | PREFILLED_SYRINGE | INTRAMUSCULAR | Status: DC
  Administered 2022-04-22: 30 mg via SUBCUTANEOUS
  Filled 2022-04-22: qty 0.3

## 2022-04-22 MED ORDER — INSULIN ASPART 100 UNIT/ML IJ SOLN
4.0000 [IU] | Freq: Three times a day (TID) | INTRAMUSCULAR | Status: DC
  Administered 2022-04-22 – 2022-04-23 (×2): 4 [IU] via SUBCUTANEOUS

## 2022-04-22 NOTE — Evaluation (Signed)
Physical Therapy Evaluation Patient Details Name: Lindsay Burnett MRN: [id number] DOB: 1990/05/24 Today's Date: 04/22/2022  History of Present Illness  Lindsay Burnett is a 32 y.o. female with medical history significant of obesity, T2DM, CKD and hypertension induced pregnancy.       Patient has been at her usual state of health until yesterday morning, when she started experiencing nausea, vomiting and abdominal pain. Symptoms started a few hours after injecting ozempic. Since then she has not been able to eat due to nausea and vomiting, that have been constant and persistent. Her last meal was the day before yesterday.   She does not identity any improving or worsening factors, her abdominal pain has been epigastric with no radiation. Through the course of the day yesterday her chronic back pain has exacerbated now being more persistent and severe.  Because persistent symptoms she presented to the ED for further evaluation.   Clinical Impression  Patient demonstrates good return for bed mobility and transfers, but has to walk slowly due to excessive BLE internal rotation with limited heel to toe stepping with most of body weight on her fore feet, frequent leaning on side rails in hallway or hand held assist, no loss of balance and limited due to fatigue.  Patient states this is not usual gait for her, but happens every time she gets sick, but usually resolves after 1 week.  Patient will benefit from continued skilled physical therapy in hospital and recommended venue below to increase strength, balance, endurance for safe ADLs and gait.      Recommendations for follow up therapy are one component of a multi-disciplinary discharge planning process, led by the attending physician.  Recommendations may be updated based on patient status, additional functional criteria and insurance authorization.  Follow Up Recommendations       Assistance Recommended at Discharge PRN  Patient can return home with  the following  A little help with walking and/or transfers;A little help with bathing/dressing/bathroom;Help with stairs or ramp for entrance;Assistance with cooking/housework    Equipment Recommendations None recommended by PT  Recommendations for Other Services       Functional Status Assessment Patient has had a recent decline in their functional status and demonstrates the ability to make significant improvements in function in a reasonable and predictable amount of time.     Precautions / Restrictions Precautions Precautions: Fall Restrictions Weight Bearing Restrictions: No      Mobility  Bed Mobility Overal bed mobility: Modified Independent                  Transfers Overall transfer level: Modified independent                      Ambulation/Gait Ambulation/Gait assistance: Supervision, Min guard Gait Distance (Feet): 100 Feet Assistive device: None, 1 person hand held assist Gait Pattern/deviations: Decreased step length - right, Decreased step length - left, Decreased stride length Gait velocity: decreased     General Gait Details: slow labored cadence with excessive BLE internal rotation and frequent leaning on side rails or requiring occasional hand held assist, no loss of balance, limited due to fatigue  Stairs            Wheelchair Mobility    Modified Rankin (Stroke Patients Only)       Balance Overall balance assessment: Needs assistance Sitting-balance support: Feet supported, No upper extremity supported Sitting balance-Leahy Scale: Good Sitting balance - Comments: seated at EOB   Standing balance support:  During functional activity, No upper extremity supported Standing balance-Leahy Scale: Fair Standing balance comment: without AD                             Pertinent Vitals/Pain Pain Assessment Pain Assessment: Faces Faces Pain Scale: Hurts a little bit Pain Location: low back Pain Descriptors /  Indicators: Discomfort Pain Intervention(s): Limited activity within patient's tolerance, Monitored during session, Repositioned    Home Living Family/patient expects to be discharged to:: Private residence Living Arrangements: Spouse/significant other Available Help at Discharge: Family;Available 24 hours/day Type of Home: House Home Access: Stairs to enter Entrance Stairs-Rails: Right;Left;Can reach both Entrance Stairs-Number of Steps: 2 Alternate Level Stairs-Number of Steps: 6-7 Home Layout: Two level Home Equipment: None      Prior Function Prior Level of Function : Independent/Modified Independent;Driving             Mobility Comments: Tourist information centre manager without AD ADLs Comments: Independent     Hand Dominance        Extremity/Trunk Assessment   Upper Extremity Assessment Upper Extremity Assessment: Overall WFL for tasks assessed    Lower Extremity Assessment Lower Extremity Assessment: Generalized weakness    Cervical / Trunk Assessment Cervical / Trunk Assessment: Normal  Communication   Communication: No difficulties  Cognition Arousal/Alertness: Awake/alert Behavior During Therapy: WFL for tasks assessed/performed Overall Cognitive Status: Within Functional Limits for tasks assessed                                          General Comments      Exercises     Assessment/Plan    PT Assessment Patient needs continued PT services  PT Problem List Decreased strength;Decreased activity tolerance;Decreased balance;Decreased mobility       PT Treatment Interventions DME instruction;Gait training;Stair training;Functional mobility training;Therapeutic activities;Therapeutic exercise;Patient/family education;Balance training    PT Goals (Current goals can be found in the Care Plan section)  Acute Rehab PT Goals Patient Stated Goal: return home with family to assist PT Goal Formulation: With patient Time For Goal Achievement:  04/25/22 Potential to Achieve Goals: Good    Frequency Min 3X/week     Co-evaluation               AM-PAC PT "6 Clicks" Mobility  Outcome Measure Help needed turning from your back to your side while in a flat bed without using bedrails?: None Help needed moving from lying on your back to sitting on the side of a flat bed without using bedrails?: None Help needed moving to and from a bed to a chair (including a wheelchair)?: None Help needed standing up from a chair using your arms (e.g., wheelchair or bedside chair)?: None Help needed to walk in hospital room?: A Little Help needed climbing 3-5 steps with a railing? : A Lot 6 Click Score: 21    End of Session   Activity Tolerance: Patient tolerated treatment well;Patient limited by fatigue Patient left: in bed;with call bell/phone within reach Nurse Communication: Mobility status PT Visit Diagnosis: Unsteadiness on feet (R26.81);Other abnormalities of gait and mobility (R26.89);Muscle weakness (generalized) (M62.81)    Time: 3354-5625 PT Time Calculation (min) (ACUTE ONLY): 18 min   Charges:   PT Evaluation $PT Eval Low Complexity: 1 Low PT Treatments $Therapeutic Activity: 8-22 mins        2:01 PM, 04/22/22  Lonell Grandchild, MPT Physical Therapist with [facility] [phone] office [phone] mobile phone

## 2022-04-22 NOTE — Progress Notes (Signed)
PROGRESS NOTE  Lindsay Burnett MVH:[id number] DOB: 1990/10/18 DOA: 04/19/2022 PCP: Patient, No Pcp Per  HPI/Recap of past 24 hours: Lindsay Burnett is a 32 y.o. female with medical history significant of obesity, T2DM, CKD3B and hypertension who presented to [facility], ED with intractable nausea and vomiting after restarting Ozempic.  States she had been on it for several months in the past, stopped for 2 months then restarted on Thursday the day prior to her presentation.  After restarting Ozempic started having nausea and vomiting.  Since then, her chronic back pain has exacerbated now being more persistent and severe.  Workup revealed AKI on CKD 3B and intractable nausea and vomiting for which GI was consulted on 04/21/2022.  Also reports left fourth toe callus, she requested evaluation, Ortho, Dr. Romeo Apple consulted.  04/21/2022: The patient was seen and examined at bedside.  Reports persistent nausea and vomiting.  States she is too weak to get out of bed and wants staff to assist with mobilization.  PT OT consult placed.  4/15: Nausea and vomiting seems improving, able to tolerate liquid diet.  Diet advanced to soft.  GI is recommending discharging home on erythromycin, concern of gastroparesis. Nephrology is on board and recommending discontinuation of Ozempic on discharge.  Creatinine remained elevated at 3.60.  No residual volume on bladder scan. Increasing the dose of Semglee to 18 and adding 4 units with meal due to elevated CBG.  Patient need to be discharged on erythromycin once renal function stabilizes.  Assessment/Plan: Principal Problem:   Intractable nausea and vomiting Active Problems:   Acute kidney injury superimposed on chronic kidney disease   Type 2 diabetes mellitus   Chronic back pain   Class 1 obesity  Intractable nausea and vomiting suspect side effect from Ozempic Likely side effect of home Ozempic GLP 1 agonist.  Symptoms started after she took the  medication. Continue to hold off home Ozempic. IV antiemetics as needed. GI consulted-recommending supportive care with erythromycin and Zofran, concern of gastroparesis along with Ozempic side effect which should not be continued on discharge Diet advanced to soft today   AKI on CKD 3B likely prerenal in the setting of dehydration from poor oral intake Baseline creatinine appears to be 1.5 with GFR 44 Presented with creatinine of 3.31 Creatinine is uptrending, renal ultrasound was nonacute. Continue to avoid nephrotoxic agents, dehydration and hypotension Continue IV fluid hydration Continue to monitor urine output  Start strict I's and O's. Nephrology consulted  Mild non anion gap metabolic acidosis Serum bicarb 20, anion gap 9, serum bicarb is uptrending 21 Continue IV fluid hydration    Type 2 diabetes with hyperglycemia Hemoglobin A1c 9.8 on 04/20/2022. Continue insulin sliding scale. Diabetes coordinator consulted for patient education  Hypertension, BP is not at goal, elevated Added Norvasc 10 mg daily IV hydralazine as needed with parameters. Continue to closely monitor vital signs   Chronic back pain As needed analgesics.   Obesity BMI 34 Recommend weight loss outpatient with regular physical activity and healthy dieting.   Left fourth toe callus, chronic Consulted orthopedic surgery, per patient's request, Dr. Romeo Apple on 04/21/2022 via paging system.    Advance Care Planning:   Code Status: Full Code    Consults: Orthopedic surgery, nephrology, GI.   Family Communication: Discussed with husband at bedside   Objective: Vitals:   04/22/22 0001 04/22/22 0537 04/22/22 0800 04/22/22 1300  BP: (!) 176/117 (!) 165/117 (!) 156/107 (!) 137/94  Pulse: 99 99 98 (!) 112  Resp:  20 20    Temp:      TempSrc:      SpO2:  100%    Weight:      Height:        Intake/Output Summary (Last 24 hours) at 04/22/2022 1457 Last data filed at 04/22/2022 0555 Gross per 24 hour   Intake 0 ml  Output 1300 ml  Net -1300 ml    Filed Weights   04/19/22 1505  Weight: 104.3 kg    Exam:  General.  Obese lady, in no acute distress. Pulmonary.  Lungs clear bilaterally, normal respiratory effort. CV.  Regular rate and rhythm, no JVD, rub or murmur. Abdomen.  Soft, nontender, nondistended, BS positive. CNS.  Alert and oriented .  No focal neurologic deficit. Extremities.  No edema, no cyanosis, pulses intact and symmetrical. Psychiatry.  Judgment and insight appears normal.    Data Reviewed: CBC: Recent Labs  Lab 04/19/22 0943 04/19/22 1013 04/20/22 0555 04/21/22 0404 04/22/22 0533  WBC 10.8*  --  11.4* 12.8* 9.1  NEUTROABS 9.2*  --   --   --   --   HGB 12.7 12.6 12.6 13.8 13.1  HCT 36.3 37.0 37.3 41.0 40.3  MCV 82.3  --  84.8 85.6 87.8  PLT 339  --  318 330 283    Basic Metabolic Panel: Recent Labs  Lab 04/19/22 1828 04/19/22 2209 04/20/22 0555 04/21/22 0404 04/22/22 0533  NA 140 142 140 140 140  K 4.2 4.4 4.1 3.7 4.0  CL 107 108 111 109 109  CO2 18* 20* 20* 21* 22  GLUCOSE 390* 369* 343* 245* 201*  BUN 36* 38* 40* 37* 36*  CREATININE 3.14* 3.31* 3.27* 3.33* 3.60*  CALCIUM 9.0 9.1 8.6* 8.6* 8.2*  MG  --   --   --  1.9 2.0  PHOS  --   --   --  3.0 3.1    GFR: Estimated Creatinine Clearance: 28.1 mL/min (A) (by C-G formula based on SCr of 3.6 mg/dL (H)). Liver Function Tests: Recent Labs  Lab 04/19/22 0943 04/22/22 0533  AST 17 63*  ALT 21 54*  ALKPHOS 212* 176*  BILITOT 0.9 0.3  PROT 8.5* 6.9  ALBUMIN 4.1 3.2*    Recent Labs  Lab 04/19/22 0943  LIPASE 51    No results for input(s): "AMMONIA" in the last 168 hours. Coagulation Profile: No results for input(s): "INR", "PROTIME" in the last 168 hours. Cardiac Enzymes: No results for input(s): "CKTOTAL", "CKMB", "CKMBINDEX", "TROPONINI" in the last 168 hours. BNP (last 3 results) No results for input(s): "PROBNP" in the last 8760 hours. HbA1C: Recent Labs     04/20/22 0555  HGBA1C 9.8*    CBG: Recent Labs  Lab 04/21/22 1125 04/21/22 1709 04/21/22 2036 04/22/22 0748 04/22/22 1132  GLUCAP 250* 205* 252* 209* 392*    Lipid Profile: No results for input(s): "CHOL", "HDL", "LDLCALC", "TRIG", "CHOLHDL", "LDLDIRECT" in the last 72 hours. Thyroid Function Tests: No results for input(s): "TSH", "T4TOTAL", "FREET4", "T3FREE", "THYROIDAB" in the last 72 hours. Anemia Panel: No results for input(s): "VITAMINB12", "FOLATE", "FERRITIN", "TIBC", "IRON", "RETICCTPCT" in the last 72 hours. Urine analysis:    Component Value Date/Time   COLORURINE YELLOW 04/19/2022 1126   APPEARANCEUR HAZY (A) 04/19/2022 1126   LABSPEC 1.012 04/19/2022 1126   PHURINE 6.0 04/19/2022 1126   GLUCOSEU >=500 (A) 04/19/2022 1126   HGBUR SMALL (A) 04/19/2022 1126   BILIRUBINUR NEGATIVE 04/19/2022 1126   BILIRUBINUR Neg 07/17/2020 1555  KETONESUR 5 (A) 04/19/2022 1126   PROTEINUR >=300 (A) 04/19/2022 1126   UROBILINOGEN 0.2 07/17/2020 1555   NITRITE NEGATIVE 04/19/2022 1126   LEUKOCYTESUR NEGATIVE 04/19/2022 1126   Sepsis Labs: @LABRCNTIP (procalcitonin:4,lacticidven:4)  )No results found for this or any previous visit (from the past 240 hour(s)).    Studies: No results found.  Scheduled Meds:  amLODipine  10 mg Oral Daily   cloNIDine  0.1 mg Transdermal Weekly   enoxaparin (LOVENOX) injection  30 mg Subcutaneous Q24H   insulin aspart  0-15 Units Subcutaneous TID WC   insulin glargine-yfgn  15 Units Subcutaneous Daily   pantoprazole (PROTONIX) IV  40 mg Intravenous Q24H    Continuous Infusions:  erythromycin 250 mg (04/22/22 1140)   lactated ringers 100 mL/hr at 04/20/22 2142   promethazine (PHENERGAN) injection (IM or IVPB) 12.5 mg (04/22/22 1113)     LOS: 2 days   This record has been created using Conservation officer, historic buildings. Errors have been sought and corrected,but may not always be located. Such creation errors do not reflect on the  standard of care.   Arnetha Courser, MD [facility] Pager [phone]  If 7PM-7AM, please contact night-coverage [email or web address] Password [facility] 04/22/2022, 2:57 PM

## 2022-04-22 NOTE — Progress Notes (Signed)
   04/22/22 1300  Assess: MEWS Score  BP (!) 137/94  MAP (mmHg) 107  Pulse Rate (!) 112  Level of Consciousness Alert  Assess: MEWS Score  MEWS Temp 0  MEWS Systolic 0  MEWS Pulse 2  MEWS RR 0  MEWS LOC 0  MEWS Score 2  MEWS Score Color Yellow  Assess: if the MEWS score is Yellow or Red  Were vital signs taken at a resting state? Yes  Focused Assessment Change from prior assessment (see assessment flowsheet)  Does the patient meet 2 or more of the SIRS criteria? Yes  Does the patient have a confirmed or suspected source of infection? No  MEWS guidelines implemented  Yes, yellow  Treat  MEWS Interventions Considered administering scheduled or prn medications/treatments as ordered  Take Vital Signs  Increase Vital Sign Frequency  Yellow: Q2hr x1, continue Q4hrs until patient remains green for 12hrs  Escalate  MEWS: Escalate Yellow: Discuss with charge nurse and consider notifying provider and/or RRT  Notify: Charge Nurse/RN  Name of Charge Nurse/RN Notified Pensions consultant, RN  Provider Notification  Provider Name/Title S. Nelson Chimes, MD  Date Provider Notified 04/22/22  Time Provider Notified 1414  Method of Notification Face-to-face  Notification Reason Change in status  Provider response No new orders (encourage to continue taking in fluids)  Date of Provider Response 04/22/22  Time of Provider Response 1414  Assess: SIRS CRITERIA  SIRS Temperature  0  SIRS Pulse 1  SIRS Respirations  0  SIRS WBC 0  SIRS Score Sum  1

## 2022-04-22 NOTE — Consult Note (Signed)
Reason for Consult: AKI/CKD stage IIIb Referring Physician: Nelson Chimes, MD  Lindsay Burnett is an 32 y.o. female with a PMH significant for DM, HTN, CKD stage IIIb, and obesity who presented to [facility] ED on 04/20/22 with a 2 day history of N/V and abdominal pain.  She reports restarting ozempic last week after being held for 2 months.  In the ED, Bp 186/103, P 108, Temp 98.1, SpO2 98%.  Labs were notable for WBC 10.8, Co2 17, BUN 36, Cr 3, and she was admitted for IVF's and antiemetics.  We were consulted due to the development of AKI/CKD stage IIIb without improvement following IVF's.  The trend in Scr is seen below.  Of note, she had a similar presentation to [facility] in November 2023.  She continues to report N/V but thinks it is starting to get a little better.    Trend in Creatinine: Creatinine, Ser  Date/Time Value Ref Range Status  04/22/2022 05:33 AM 3.60 (H) 0.44 - 1.00 mg/dL Final  96/04/5407 81:19 AM 3.33 (H) 0.44 - 1.00 mg/dL Final  14/78/2956 21:30 AM 3.27 (H) 0.44 - 1.00 mg/dL Final  86/57/8469 62:95 PM 3.31 (H) 0.44 - 1.00 mg/dL Final  28/41/3244 01:02 PM 3.14 (H) 0.44 - 1.00 mg/dL Final  72/53/6644 03:47 AM 3.30 (H) 0.44 - 1.00 mg/dL Final  42/59/5638 75:64 AM 3.00 (H) 0.44 - 1.00 mg/dL Final  33/29/5188 41:66 AM 2.82 (H)    11/19/2021 08:34 AM 3.01 (H)    11/18/2021 3.56 (H)    07/10/2021 2.07 (H)    08/15/2020 10:55 AM 1.58 (H) 0.44 - 1.00 mg/dL Final  07/07/1599 09:32 PM 1.51 (H) 0.44 - 1.00 mg/dL Final  35/57/3220 25:42 AM 1.54 (H) 0.44 - 1.00 mg/dL Final  70/62/3762 83:15 AM 1.14 (H) 0.44 - 1.00 mg/dL Final  17/61/6073 71:06 AM 1.06 (H) 0.57 - 1.00 mg/dL Final  26/94/8546 27:03 AM 1.06 (H) 0.57 - 1.00 mg/dL Final  50/09/3816 29:93 AM 0.96 0.57 - 1.00 mg/dL Final  71/69/6789 38:10 PM 1.04 (H) 0.57 - 1.00 mg/dL Final  17/51/0258 52:77 AM 0.83 0.57 - 1.00 mg/dL Final  82/42/3536 14:43 AM 1.17 (H) 0.57 - 1.00 mg/dL Final  15/40/0867 61:95 AM 0.97 0.44 - 1.00 mg/dL Final     PMH:   Past Medical History:  Diagnosis Date   Blood transfusion without reported diagnosis    Diabetes mellitus without complication    Pregnancy induced hypertension     PSH:   Past Surgical History:  Procedure Laterality Date   CESAREAN SECTION     2 cesarean sections   CESAREAN SECTION N/A 08/12/2020   Procedure: CESAREAN SECTION, ADD LILETTA IUD;  Surgeon: Tereso Newcomer, MD;  Location: [facility] LD ORS;  Service: Obstetrics;  Laterality: N/A;    Allergies:  Allergies  Allergen Reactions   Compazine [Prochlorperazine Edisylate]     "Stroke like symptoms"   Reglan [Metoclopramide]     "Stroke like symptoms"    Medications:   Prior to Admission medications   Medication Sig Start Date End Date Taking? Authorizing Provider  insulin glargine (LANTUS SOLOSTAR) 100 UNIT/ML Solostar Pen Inject 10 Units into the skin at bedtime. 08/15/20  Yes Anyanwu, Jethro Bastos, MD  insulin lispro (HUMALOG) 100 UNIT/ML KwikPen Inject 5-8 Units into the skin 3 (three) times daily with meals. 01/12/19  Yes [provider]  OZEMPIC, 1 MG/DOSE, 4 MG/3ML SOPN Inject 1 mg into the skin once a week.   Yes [provider]  Inpatient medications:  amLODipine  10 mg Oral Daily   cloNIDine  0.1 mg Transdermal Weekly   enoxaparin (LOVENOX) injection  30 mg Subcutaneous Q24H   insulin aspart  0-15 Units Subcutaneous TID WC   insulin glargine-yfgn  15 Units Subcutaneous Daily   pantoprazole (PROTONIX) IV  40 mg Intravenous Q24H    Discontinued Meds:   Medications Discontinued During This Encounter  Medication Reason   Blood Glucose Monitoring Suppl (GLUCOCOM BLOOD GLUCOSE MONITOR) DEVI Patient Preference   Blood Pressure Monitoring (BLOOD PRESSURE KIT) DEVI Patient Preference   Continuous Blood Gluc Sensor (DEXCOM G6 SENSOR) MISC Patient Preference   Continuous Blood Gluc Transmit (DEXCOM G6 TRANSMITTER) MISC Patient Preference   cyclobenzaprine (FLEXERIL) 10 MG tablet Completed Course    docusate sodium (COLACE) 100 MG capsule Patient Preference   ferrous sulfate (FERROUSUL) 325 (65 FE) MG tablet Patient Preference   NIFEdipine (ADALAT CC) 60 MG 24 hr tablet Patient Preference   norgestimate-ethinyl estradiol (ORTHO-CYCLEN) 0.25-35 MG-MCG tablet Patient Preference   oxyCODONE-acetaminophen (PERCOCET/ROXICET) 5-325 MG tablet Completed Course   Prenatal MV & Min w/FA-DHA (PRENATAL ADULT GUMMY/DHA/FA PO) Patient Preference   Insulin Pen Needle (BD PEN NEEDLE NANO U/F) 32G X 4 MM MISC Patient Preference   metFORMIN (GLUCOPHAGE) 500 MG tablet Patient Preference   insulin aspart (novoLOG) injection 0-9 Units    insulin glargine-yfgn (SEMGLEE) injection 10 Units    insulin glargine-yfgn (SEMGLEE) injection 10 Units    insulin glargine-yfgn (SEMGLEE) injection 10 Units    ondansetron (ZOFRAN) tablet 4 mg    ondansetron (ZOFRAN) injection 4 mg    promethazine (PHENERGAN) 12.5 mg in sodium chloride 0.9 % 50 mL IVPB    insulin glargine-yfgn (SEMGLEE) injection 10 Units    enoxaparin (LOVENOX) injection 40 mg     Social History:  reports that she has been smoking cigarettes. She has a 2.00 pack-year smoking history. She has never used smokeless tobacco. She reports that she does not currently use alcohol. She reports that she does not use drugs.  Family History:   Family History  Problem Relation Age of Onset   Diabetes Mother    Diabetes Maternal Grandmother     Pertinent items are noted in HPI. Weight change:   Intake/Output Summary (Last 24 hours) at 04/22/2022 0850 Last data filed at 04/22/2022 0555 Gross per 24 hour  Intake 120 ml  Output 1300 ml  Net -1180 ml   BP (!) 156/107 (BP Location: Right Wrist)   Pulse 98   Temp 98.4 F (36.9 C) (Oral)   Resp 20   Ht 5\' 8"  (1.727 m)   Wt 104.3 kg   LMP 04/15/2022   SpO2 100%   BMI 34.97 kg/m  Vitals:   04/21/22 2011 04/22/22 0001 04/22/22 0537 04/22/22 0800  BP: (!) 213/125 (!) 176/117 (!) 165/117 (!) 156/107   Pulse: (!) 108 99 99 98  Resp: 20 20 20    Temp:      TempSrc:      SpO2:   100%   Weight:      Height:         General appearance: alert, cooperative, no distress, and moderately obese Head: Normocephalic, without obvious abnormality, atraumatic Resp: clear to auscultation bilaterally Cardio: regular rate and rhythm, S1, S2 normal, no murmur, click, rub or gallop GI: soft, non-tender; bowel sounds normal; no masses,  no organomegaly Extremities: extremities normal, atraumatic, no cyanosis or edema  Labs: Basic Metabolic Panel: Recent Labs  Lab 04/19/22 0943 04/19/22  1013 04/19/22 1828 04/19/22 2209 04/20/22 0555 04/21/22 0404 04/22/22 0533  NA 136 140 140 142 140 140 140  K 4.2 4.4 4.2 4.4 4.1 3.7 4.0  CL 105 109 107 108 111 109 109  CO2 17*  --  18* 20* 20* 21* 22  GLUCOSE 362* 377* 390* 369* 343* 245* 201*  BUN 36* 34* 36* 38* 40* 37* 36*  CREATININE 3.00* 3.30* 3.14* 3.31* 3.27* 3.33* 3.60*  ALBUMIN 4.1  --   --   --   --   --  3.2*  CALCIUM 9.2  --  9.0 9.1 8.6* 8.6* 8.2*  PHOS  --   --   --   --   --  3.0 3.1   Liver Function Tests: Recent Labs  Lab 04/19/22 0943 04/22/22 0533  AST 17 63*  ALT 21 54*  ALKPHOS 212* 176*  BILITOT 0.9 0.3  PROT 8.5* 6.9  ALBUMIN 4.1 3.2*   Recent Labs  Lab 04/19/22 0943  LIPASE 51   No results for input(s): "AMMONIA" in the last 168 hours. CBC: Recent Labs  Lab 04/19/22 0943 04/19/22 1013 04/20/22 0555 04/21/22 0404 04/22/22 0533  WBC 10.8*  --  11.4* 12.8* 9.1  NEUTROABS 9.2*  --   --   --   --   HGB 12.7 12.6 12.6 13.8 13.1  HCT 36.3 37.0 37.3 41.0 40.3  MCV 82.3  --  84.8 85.6 87.8  PLT 339  --  318 330 283   PT/INR: @LABRCNTIP (inr:5) Cardiac Enzymes: )No results for input(s): "CKTOTAL", "CKMB", "CKMBINDEX", "TROPONINI" in the last 168 hours. CBG: Recent Labs  Lab 04/21/22 0742 04/21/22 1125 04/21/22 1709 04/21/22 2036 04/22/22 0748  GLUCAP 234* 250* 205* 252* 209*    Iron Studies: No results  for input(s): "IRON", "TIBC", "TRANSFERRIN", "FERRITIN" in the last 168 hours.  Xrays/Other Studies: US RENAL  Result Date: 04/20/2022 CLINICAL DATA:  Acute kidney injury EXAM: RENAL / URINARY TRACT ULTRASOUND COMPLETE COMPARISON:  Noncontrast CT yesterday FINDINGS: Right Kidney: Renal measurements: 9.3 x 4.6 x 5.4 cm = volume: 121 mL. Normal parenchymal echogenicity. No hydronephrosis. No visualized stone or focal lesion. Left Kidney: Renal measurements: 10.5 x 5.1 x 4.5 cm = volume: 128 mL. Normal parenchymal echogenicity. No hydronephrosis. No visualized stone or focal lesion. Bladder: Moderately distended, volume = 530 cm^3. Appears normal for degree of bladder distention. Other: Incidental increased hepatic echogenicity typical of steatosis. IMPRESSION: 1. Normal sonographic appearance of the kidneys. 2. Moderately distended urinary bladder, normal for degree of distension. Electronically Signed   By: Narda Rutherford M.D.   On: 04/20/2022 16:05     Assessment/Plan:  AKI/CKD stage IIIb - likely ischemic ATN in setting of volume depletion as well a component of HTN urgency (unable to keep down her bp meds) +/- obstructive uropathy (Renal US with moderately distended bladder).  Continue with IVF's and resumption of her home bp meds.  Bladder scan daily and if PVR >300 mL would recommend foley catheter insertion.  No uremic symptoms at this time or indication for dialysis.  Will continue to follow closely.  Was not on ACE/ARB or SGLT-2 inhibitors.   Avoid nephrotoxic medications including NSAIDs and iodinated intravenous contrast exposure unless the latter is absolutely indicated.   Preferred narcotic agents for pain control are hydromorphone, fentanyl, and methadone. Morphine should not be used.  Avoid Baclofen and avoid oral sodium phosphate and magnesium citrate based laxatives / bowel preps.  Continue strict Input and Output monitoring. Will monitor  the patient closely with you and intervene or  adjust therapy as indicated by changes in clinical status/labs   Intractable nausea and vomiting - presumably due to side effects of Ozempic. GI consulted.  Had similar admission in November and Ozempic had been held since discharge until last week.  Antiemetics per primary svc. Distended bladder - likely neurogenic bladder due to DM.  Check daily bladder scans and if PVR >300 mL would recommend foley catheter placement.  Hypertensive urgency - due to inability to keep down po meds.  Resume home bp meds, amlodipine 10 mg daily, labetalol 200 mg bid once able to take po.  Agree with catapress patch for now but may need to increase the dose. DM type 2 - per primary svc.  Agree with stopping Ozempic. Chronic back pain - per primary Obesity - BMI 34.   Julien Nordmann Candus Braud 04/22/2022, 8:50 AM

## 2022-04-22 NOTE — Discharge Instructions (Addendum)
Please call [facility] to ask about getting established there for DM management.   [facility] Endocrinologist [place] Address: [address] [place], [place], [place] [address] Office Phone: [phone]

## 2022-04-22 NOTE — Inpatient Diabetes Management (Addendum)
Inpatient Diabetes Program Recommendations  AACE/ADA: New Consensus Statement on Inpatient Glycemic Control   Target Ranges:  Prepandial:   less than 140 mg/dL      Peak postprandial:   less than 180 mg/dL (1-2 hours)      Critically ill patients:  140 - 180 mg/dL    Latest Reference Range & Units 04/21/22 07:42 04/21/22 11:25 04/21/22 17:09 04/21/22 20:36 04/22/22 07:48  Glucose-Capillary 70 - 99 mg/dL 497 (H) 026 (H) 378 (H) 252 (H) 209 (H)    Latest Reference Range & Units 04/19/22 09:43 04/20/22 05:55  Hemoglobin A1C 4.8 - 5.6 % 9.7 (H) 9.8 (H)   Review of Glycemic Control  Diabetes history: DM2 Outpatient Diabetes medications: Lantus 10 units daily (ran out months ago and no refills), Humalog 5-8 units TID (ran out months ago and no refills), Ozempic 1 mg Qweek  Current orders for Inpatient glycemic control: Semglee 15 units daily, Novolog 0-9 units TID with meals  Inpatient Diabetes Program Recommendations:     Insulin: Please consider increasing Semglee to 18 units daily and adding Novolog 4 units TID with meals if patient is eating at least 50% of meals.  HbgA1C:  A1C 9.7% on 04/19/22 indicating an average glucose of 232 mg/dl to evaluate glycemic control over the past 2-3 months.  Outpatient: At discharge will need Rx for Lantus SoloStar [phone]), Humalog Kwikpen [phone]), and Dexcom G7 [phone]).   NOTE: Patient admitted with intractable nausea and vomiting and AKI on CKD. Per H&P,  intractable nausea and vomiting after restarting Ozempic.  States she had been on it for several months in the past, stopped for 2 months then restarted on Thursday the day prior to her presentation.  After restarting Ozempic started having nausea and vomiting." Noted in reviewing the chart, patient had televisit with Dr. Rozann Lesches [facility]) on 02/06/22 and it was noted that A1C was 7.5% on 11/18/21 and 5.9% on 03/03/21. Per note on 02/06/22, "May not need medication for glycemic  control will reassess HbA1c and determine if she needs medication. If >7% will restart Ozempic."   Addendum 04/22/22@10 :40-Spoke to patient over the phone. Patient states that she has been without medication for several months due to no refills and not able to get appointment with new PCP until July 08, 2022. Patient states that she was doing televisits with Endocrinology at [facility] because she lives 3 hours away and so the provider told her she needed to get an Endocrinologist closer to her home so she could be seen in person. Patient reports that she had been taking Lantus 10 units daily, Humalog 5-8 units TID with meals, and Ozempic 1 mg Qweek. Patient states she has ran out of Lantus and Humalog and had not taken Ozempic for months but she decided to take an injection of Ozempic on Thursday and started having N/V after that. Patient states she has been doing finger sticks for glucose monitoring recently and her glucose has been in the 200's or higher at home lately. Patient states she had been on Dexcom G6 CGM but did not have any refills for it either which is why she had to start doing finger sticks again. Discussed current A1C of 9.7% on 04/19/22 and explained that current A1C indicates an average glucose of 232 mg/dl over the past 2-3 months. Patient states she would like to find an Endocrinologist closer to home. Informed patient that Endocrinologist in [facility] is [facility]; will add contact information for [facility] to discharge  paperwork. Encouraged patient to call and see if she could get established there for assistance with DM management. Patient states she was using insulin pens and she has plenty of insulin pen needles at home. Patient verbalized understanding of information and states she has not questions at this time.  At discharge will need Rx for Lantus SoloStar [phone]), Humalog Kwikpen [phone]), and Dexcom G7 [phone]).   Thanks, Orlando Penner, RN, MSN,  CDCES Diabetes Coordinator Inpatient Diabetes Program [phone] (Team Pager from 8am to 5pm)

## 2022-04-22 NOTE — Progress Notes (Signed)
Gastroenterology Progress Note    Patient ID: Lindsay Burnett; [id number]; Feb 14, 1990    Subjective   Vomiting twice last night. Still nauseated intermittently but feels it has improved overall. Previously, nausea was constant. Ate an [place] ice. First time eating since admitted. Wants to try another [place] ice.    Objective   Vital signs in last 24 hours Temp:  [98.4 F (36.9 C)] 98.4 F (36.9 C) (04/14 1204) Pulse Rate:  [99-108] 99 (04/15 0537) Resp:  [18-20] 20 (04/15 0537) BP: (165-213)/(110-125) 165/117 (04/15 0537) SpO2:  [100 %] 100 % (04/15 0537) Last BM Date : 04/18/22  Physical Exam General:   Alert and oriented, pleasant Head:  Normocephalic and atraumatic. Abdomen:  Bowel sounds present, soft, mildly TTP epigastric abdomen Msk:  Symmetrical without gross deformities. Normal posture. Extremities:  Without edema. Neurologic:  Alert and  oriented x4    Intake/Output from previous day: 04/14 0701 - 04/15 0700 In: 120 [P.O.:120] Out: 1300 [Urine:1300] Intake/Output this shift: No intake/output data recorded.  Lab Results  Recent Labs    04/20/22 0555 04/21/22 0404 04/22/22 0533  WBC 11.4* 12.8* 9.1  HGB 12.6 13.8 13.1  HCT 37.3 41.0 40.3  PLT 318 330 283   BMET Recent Labs    04/20/22 0555 04/21/22 0404 04/22/22 0533  NA 140 140 140  K 4.1 3.7 4.0  CL 111 109 109  CO2 20* 21* 22  GLUCOSE 343* 245* 201*  BUN 40* 37* 36*  CREATININE 3.27* 3.33* 3.60*  CALCIUM 8.6* 8.6* 8.2*   LFT Recent Labs    04/19/22 0943 04/22/22 0533  PROT 8.5* 6.9  ALBUMIN 4.1 3.2*  AST 17 63*  ALT 21 54*  ALKPHOS 212* 176*  BILITOT 0.9 0.3    Studies/Results US RENAL  Result Date: 04/20/2022 CLINICAL DATA:  Acute kidney injury EXAM: RENAL / URINARY TRACT ULTRASOUND COMPLETE COMPARISON:  Noncontrast CT yesterday FINDINGS: Right Kidney: Renal measurements: 9.3 x 4.6 x 5.4 cm = volume: 121 mL. Normal parenchymal echogenicity. No hydronephrosis. No  visualized stone or focal lesion. Left Kidney: Renal measurements: 10.5 x 5.1 x 4.5 cm = volume: 128 mL. Normal parenchymal echogenicity. No hydronephrosis. No visualized stone or focal lesion. Bladder: Moderately distended, volume = 530 cm^3. Appears normal for degree of bladder distention. Other: Incidental increased hepatic echogenicity typical of steatosis. IMPRESSION: 1. Normal sonographic appearance of the kidneys. 2. Moderately distended urinary bladder, normal for degree of distension. Electronically Signed   By: Narda Rutherford M.D.   On: 04/20/2022 16:05   CT ABDOMEN PELVIS WO CONTRAST  Result Date: 04/19/2022 CLINICAL DATA:  Acute generalized abdominal pain. EXAM: CT ABDOMEN AND PELVIS WITHOUT CONTRAST TECHNIQUE: Multidetector CT imaging of the abdomen and pelvis was performed following the standard protocol without IV contrast. RADIATION DOSE REDUCTION: This exam was performed according to the departmental dose-optimization program which includes automated exposure control, adjustment of the mA and/or kV according to patient size and/or use of iterative reconstruction technique. COMPARISON:  November 18, 2021. FINDINGS: Lower chest: No acute abnormality. Hepatobiliary: Mild cholelithiasis is noted. No biliary dilatation is noted. Hepatic steatosis. Pancreas: Unremarkable. No pancreatic ductal dilatation or surrounding inflammatory changes. Spleen: Normal in size without focal abnormality. Adrenals/Urinary Tract: Adrenal glands are unremarkable. Kidneys are normal, without renal calculi, focal lesion, or hydronephrosis. Bladder is unremarkable. Stomach/Bowel: Stomach is within normal limits. Appendix appears normal. No evidence of bowel wall thickening, distention, or inflammatory changes. Vascular/Lymphatic: No significant vascular findings are present. No enlarged abdominal or  pelvic lymph nodes. Reproductive: Uterus and bilateral adnexa are unremarkable. Other: No abdominal wall hernia or  abnormality. No abdominopelvic ascites. Musculoskeletal: No acute or significant osseous findings. IMPRESSION: Hepatic steatosis. Mild cholelithiasis without evidence of inflammation. Electronically Signed   By: Lupita Raider M.D.   On: 04/19/2022 12:49   DG Chest Port 1 View  Result Date: 04/19/2022 CLINICAL DATA:  Abdominal pain EXAM: PORTABLE CHEST 1 VIEW COMPARISON:  CT 08/03/2018 FINDINGS: Heart size and mediastinal contours are unremarkable. Lung volumes are low. No pleural fluid, interstitial edema or airspace disease. The visualized osseous structures are unremarkable. IMPRESSION: 1. Low lung volumes. 2. No acute findings. Electronically Signed   By: Signa Kell M.D.   On: 04/19/2022 11:29    Assessment  32 y.o. female with a history of uncontrolled diabetes complicated by CKD, presenting this admission with recurrent N/V and abdominal pain following restarting Ozempic.   She notes vomiting twice overnight but has felt improved this morning and would like to eat another [place]. Severity of nausea and frequency has improved. Will keep on erythromycin scheduled and phenergan prn. Continue clears for now but may be able to advance later this evening to soft diet.   Elevated LFTs: AST 63, ALT 54 today, Alk Phos 176, previously 212 several days ago. She has had intermittently elevated transaminases and alk phos. Known gallstones and hepatic steatosis. Suspect due to underlying steatosis. Acute hepatitis panel negative in Nov 2023. Would recommend following as outpatient with further serologies as appropriate. If concern for biliary etiology, can pursue US abdomen. However, she is clinically improving.    Plan / Recommendations  Continue clears for now but advance to soft diet as tolerated hopefully later today PPI daily Erythromycin every 6 hours Phenergan prn Follow HFP: [place] if trending up Consider EGD if not able to advance diet Avoid GLP-1 agonists    LOS: 2 days     04/22/2022, 8:31 AM  Gelene Mink, PhD, ANP-BC [facility]

## 2022-04-22 NOTE — Plan of Care (Signed)
  Problem: Acute Rehab PT Goals(only PT should resolve) Goal: Pt Will Go Supine/Side To Sit Outcome: Progressing Flowsheets (Taken 04/22/2022 1403) Pt will go Supine/Side to Sit: Independently Goal: Patient Will Transfer Sit To/From Stand Outcome: Progressing Flowsheets (Taken 04/22/2022 1403) Patient will transfer sit to/from stand: Independently Goal: Pt Will Transfer Bed To Chair/Chair To Bed Outcome: Progressing Flowsheets (Taken 04/22/2022 1403) Pt will Transfer Bed to Chair/Chair to Bed: with modified independence Goal: Pt Will Ambulate Outcome: Progressing Flowsheets (Taken 04/22/2022 1403) Pt will Ambulate:  > 125 feet  Independently  with modified independence  with least restrictive assistive device   2:04 PM, 04/22/22 Ocie Bob, MPT Physical Therapist with [facility] [phone] office [phone] mobile phone

## 2022-04-22 NOTE — Progress Notes (Signed)
Pt tolerated soft diet. denies nausea and vomiting.

## 2022-04-23 ENCOUNTER — Telehealth: Payer: Self-pay | Admitting: Gastroenterology

## 2022-04-23 DIAGNOSIS — R1084 Generalized abdominal pain: Secondary | ICD-10-CM | POA: Diagnosis not present

## 2022-04-23 DIAGNOSIS — N189 Chronic kidney disease, unspecified: Secondary | ICD-10-CM | POA: Diagnosis not present

## 2022-04-23 DIAGNOSIS — R112 Nausea with vomiting, unspecified: Secondary | ICD-10-CM | POA: Diagnosis not present

## 2022-04-23 DIAGNOSIS — N179 Acute kidney failure, unspecified: Secondary | ICD-10-CM | POA: Diagnosis not present

## 2022-04-23 LAB — RENAL FUNCTION PANEL
Albumin: 2.6 g/dL — ABNORMAL LOW (ref 3.5–5.0)
Anion gap: 4 — ABNORMAL LOW (ref 5–15)
BUN: 34 mg/dL — ABNORMAL HIGH (ref 6–20)
CO2: 22 mmol/L (ref 22–32)
Calcium: 7.5 mg/dL — ABNORMAL LOW (ref 8.9–10.3)
Chloride: 107 mmol/L (ref 98–111)
Creatinine, Ser: 3.3 mg/dL — ABNORMAL HIGH (ref 0.44–1.00)
GFR, Estimated: 18 mL/min — ABNORMAL LOW (ref >60)
Glucose, Bld: 145 mg/dL — ABNORMAL HIGH (ref 70–99)
Phosphorus: 3.3 mg/dL (ref 2.5–4.6)
Potassium: 3.2 mmol/L — ABNORMAL LOW (ref 3.5–5.1)
Sodium: 133 mmol/L — ABNORMAL LOW (ref 135–145)

## 2022-04-23 LAB — HEPATIC FUNCTION PANEL
ALT: 33 U/L (ref 0–44)
AST: 17 U/L (ref 15–41)
Albumin: 2.6 g/dL — ABNORMAL LOW (ref 3.5–5.0)
Alkaline Phosphatase: 156 U/L — ABNORMAL HIGH (ref 38–126)
Bilirubin, Direct: 0.1 mg/dL (ref 0.0–0.2)
Indirect Bilirubin: 0.1 mg/dL — ABNORMAL LOW (ref 0.3–0.9)
Total Bilirubin: 0.2 mg/dL — ABNORMAL LOW (ref 0.3–1.2)
Total Protein: 5.7 g/dL — ABNORMAL LOW (ref 6.5–8.1)

## 2022-04-23 LAB — GLUCOSE, CAPILLARY
Glucose-Capillary: 138 mg/dL — ABNORMAL HIGH (ref 70–99)
Glucose-Capillary: 271 mg/dL — ABNORMAL HIGH (ref 70–99)

## 2022-04-23 MED ORDER — PROMETHAZINE HCL 12.5 MG PO TABS: 12.5000 mg | ORAL_TABLET | Freq: Four times a day (QID) | ORAL | 0 refills | Status: DC | PRN

## 2022-04-23 MED ORDER — LACTATED RINGERS IV SOLN: INTRAVENOUS | Status: DC

## 2022-04-23 MED ORDER — ERYTHROMYCIN BASE 250 MG PO TABS
250.0000 mg | ORAL_TABLET | Freq: Three times a day (TID) | ORAL | Status: DC
  Administered 2022-04-23: 250 mg via ORAL
  Filled 2022-04-23: qty 1

## 2022-04-23 MED ORDER — CLONIDINE 0.1 MG/24HR TD PTWK: 0.1000 mg | MEDICATED_PATCH | TRANSDERMAL | 12 refills | Status: AC

## 2022-04-23 MED ORDER — CLONIDINE 0.1 MG/24HR TD PTWK: 0.1000 mg | MEDICATED_PATCH | TRANSDERMAL | 12 refills | Status: DC

## 2022-04-23 MED ORDER — PANTOPRAZOLE SODIUM 40 MG PO TBEC: 40.0000 mg | DELAYED_RELEASE_TABLET | Freq: Every day | ORAL | 1 refills | Status: DC

## 2022-04-23 MED ORDER — POTASSIUM CHLORIDE CRYS ER 20 MEQ PO TBCR
40.0000 meq | EXTENDED_RELEASE_TABLET | Freq: Once | ORAL | Status: AC
  Administered 2022-04-23: 40 meq via ORAL
  Filled 2022-04-23: qty 2

## 2022-04-23 MED ORDER — LANTUS SOLOSTAR 100 UNIT/ML ~~LOC~~ SOPN: 20.0000 [IU] | PEN_INJECTOR | Freq: Every day | SUBCUTANEOUS | 11 refills | Status: AC

## 2022-04-23 MED ORDER — PANTOPRAZOLE SODIUM 40 MG PO TBEC: 40.0000 mg | DELAYED_RELEASE_TABLET | Freq: Every day | ORAL | 1 refills | Status: AC

## 2022-04-23 MED ORDER — AMLODIPINE BESYLATE 10 MG PO TABS: 10.0000 mg | ORAL_TABLET | Freq: Every day | ORAL | 1 refills | Status: AC

## 2022-04-23 MED ORDER — ERYTHROMYCIN BASE 250 MG PO TABS: 250.0000 mg | ORAL_TABLET | Freq: Two times a day (BID) | ORAL | 0 refills | Status: AC

## 2022-04-23 MED ORDER — AMLODIPINE BESYLATE 10 MG PO TABS: 10.0000 mg | ORAL_TABLET | Freq: Every day | ORAL | 1 refills | Status: DC

## 2022-04-23 MED ORDER — ERYTHROMYCIN BASE 250 MG PO TABS: 250.0000 mg | ORAL_TABLET | Freq: Two times a day (BID) | ORAL | 0 refills | Status: DC

## 2022-04-23 MED ORDER — LANTUS SOLOSTAR 100 UNIT/ML ~~LOC~~ SOPN: 20.0000 [IU] | PEN_INJECTOR | Freq: Every day | SUBCUTANEOUS | 11 refills | Status: DC

## 2022-04-23 MED ORDER — PROMETHAZINE HCL 12.5 MG PO TABS: 12.5000 mg | ORAL_TABLET | Freq: Four times a day (QID) | ORAL | 0 refills | Status: AC | PRN

## 2022-04-23 NOTE — Progress Notes (Signed)
Mobility Specialist Progress Note:    04/23/22 1126  Mobility  Activity Ambulated independently in hallway  Level of Assistance Independent  Assistive Device None  Distance Ambulated (ft) 200 ft  Activity Response Tolerated well  Mobility Referral Yes  $Mobility charge 1 Mobility   Pt agreeable to mobility session. No AD required, pt ambulated independently in hallway. Tolerated well, asx throughout. Returned pt to room, all needs met.  Feliciana Rossetti Mobility Specialist Please contact via Special educational needs teacher or  Rehab office at [phone]

## 2022-04-23 NOTE — Telephone Encounter (Signed)
Patient getting discharged from hospital today. Please arrange hospital follow-up in 2-4 weeks.

## 2022-04-23 NOTE — Progress Notes (Signed)
OT Cancellation Note  Patient Details Name: Lindsay Burnett MRN: [id number] DOB: 08-Aug-1990   Cancelled Treatment:    Reason Eval/Treat Not Completed: OT screened, no needs identified, will sign off. Reviewed PT note and discussed patient with treating PT. Patient is able to ambulate and go to the bathroom on her own. Pt will be removed from OT list.   Danie Chandler OT, MOT   Danie Chandler 04/23/2022, 8:11 AM

## 2022-04-23 NOTE — Progress Notes (Signed)
    Subjective: N/V resolved. Feeling very well. No nausea or vomiting yesterday. Are all of her dinner which was spaghetti. Had cereal this morning. No abdominal pain. No chronic issues with N/V prior to starting Ozempic. No brbpr or melena. Bowels are moving well.   Objective: Vital signs in last 24 hours: Temp:  [97.6 F (36.4 C)] 97.6 F (36.4 C) (04/16 0410) Pulse Rate:  [86-112] 86 (04/16 0847) Resp:  [16-20] 16 (04/16 0410) BP: (102-137)/(56-94) 124/87 (04/16 0847) SpO2:  [100 %] 100 % (04/16 0410) Last BM Date : 04/22/22 General:   Alert and oriented, pleasant Abdomen:  Bowel sounds present, soft, non-tender, non-distended.  Extremities:  Without edema. Psych:  Normal mood and affect.  Intake/Output from previous day: 04/15 0701 - 04/16 0700 In: 2094.5 [I.V.:1421.9; IV Piggyback:672.6] Out: 2000 [Urine:2000] Intake/Output this shift: No intake/output data recorded.  Lab Results: Recent Labs    04/21/22 0404 04/22/22 0533  WBC 12.8* 9.1  HGB 13.8 13.1  HCT 41.0 40.3  PLT 330 283   BMET Recent Labs    04/21/22 0404 04/22/22 0533 04/23/22 0448  NA 140 140 133*  K 3.7 4.0 3.2*  CL 109 109 107  CO2 21* 22 22  GLUCOSE 245* 201* 145*  BUN 37* 36* 34*  CREATININE 3.33* 3.60* 3.30*  CALCIUM 8.6* 8.2* 7.5*   LFT Recent Labs    04/22/22 0533 04/23/22 0448 04/23/22 0449  PROT 6.9  --  5.7*  ALBUMIN 3.2* 2.6* 2.6*  AST 63*  --  17  ALT 54*  --  33  ALKPHOS 176*  --  156*  BILITOT 0.3  --  0.2*  BILIDIR  --   --  0.1  IBILI  --   --  0.1*    Assessment: 32 y.o. female with history of uncontrolled diabetes complicated by CKD, who presented to the hospital after acute onset nausea, vomiting, and abdominal pain after restarting Ozempic. Symptoms now resolved and she is tolerating a soft diet with no problem after stopping Ozempic and starting erythromycin. She has no chronic GI symptoms prior to this.   Suspect her symptoms were likely secondary to the  combination of poorly controlled diabetes and GLP-1 agonist.   She had a bump in her LFTs yesterday, but AST and ALT normalized. Alk phos improving. No need for further evaluation at this time.    Plan: Continue erythromycin while inpatient. Would be reasonable to complete 8 week course of daily PPI.  Avoid GLP-1 agonist.  GI will sign off.    LOS: 3 days    04/23/2022, 9:55 AM   Ermalinda Memos, PA-C [facility]

## 2022-04-23 NOTE — Discharge Summary (Addendum)
Physician Discharge Summary   Patient: Lindsay Burnett MRN: [id number] DOB: 11/15/1990  Admit date:     04/19/2022  Discharge date: 04/23/22  Discharge Physician: Arnetha Courser   PCP: Patient, No Pcp Per   Recommendations at discharge:  Please obtain renal function within next couple of days Follow-up with nephrology in the next 2 to 3 days Follow-up with primary care provider Follow-up with gastroenterology  Discharge Diagnoses: Principal Problem:   Nausea and vomiting Active Problems:   Acute kidney injury superimposed on chronic kidney disease   Type 2 diabetes mellitus   Chronic back pain   Class 1 obesity   Bilateral thoracic back pain   Generalized abdominal pain   Hospital Course: Lindsay Burnett is a 32 y.o. female with medical history significant of obesity, T2DM, CKD3B and hypertension who presented to [facility], ED with intractable nausea and vomiting after restarting Ozempic.  States she had been on it for several months in the past, stopped for 2 months then restarted on Thursday the day prior to her presentation.  After restarting Ozempic started having nausea and vomiting.  Since then, her chronic back pain has exacerbated now being more persistent and severe.  Workup revealed AKI on CKD 3B and intractable nausea and vomiting for which GI was consulted on 04/21/2022.  Also reports left fourth toe callus, she requested evaluation, Ortho, Dr. Romeo Apple consulted.   04/21/2022: The patient was seen and examined at bedside.  Reports persistent nausea and vomiting.  States she is too weak to get out of bed and wants staff to assist with mobilization.  PT OT consult placed.   4/15: Nausea and vomiting seems improving, able to tolerate liquid diet.  Diet advanced to soft.  GI is recommending discharging home on erythromycin, concern of gastroparesis. Nephrology is on board and recommending discontinuation of Ozempic on discharge.  Creatinine remained elevated at 3.60.  No  residual volume on bladder scan. Increasing the dose of Semglee to 18 and adding 4 units with meal due to elevated CBG.  4/16: Patient remained stable.  Nausea and vomiting resolved and she was able to tolerate diet.  Renal function started improving.  Able to void well.  Nephrology would like to keep for another day but patient really wants to go home and requesting discharge.  She is being discharged with instructions to follow-up with nephrology with her next couple of days.  Nephrology also ordered urinary creatinine and protein ratio which will be followed up by her nephrologist.  GI signed off with suggestion to continue Protonix for 8 weeks. She was also given 5 more days of erythromycin to help with gastric emptying and can follow-up with her providers for more if needed.  Her home Ozempic was discontinued as it was thought to be contributory to her current symptoms.  Lantus dose was increased to 20 units and she will continue with her homolog.  She was also started on amlodipine and clonidine patch for better control of blood pressure which improved after starting these medications.  Patient will need a close follow-up with her nephrologist, primary care provider and gastroenterologist.  Assessment and Plan: * Nausea and vomiting Likely side effect of GLP 1 agonist  Plan to continue supportive medical care with IV fluids, IV antiacids. As needed analgesics and antiemetics.    Acute kidney injury superimposed on chronic kidney disease CKD stage 2.  Plan to continue IV fluids with balanced electrolytes solutions. Follow up renal panel in am. Avoid hypotension and nephrotoxic medications.  Type 2 diabetes mellitus Positive mild DKA.  Will give basal insulin today 10 units Close follow up on BMP Continue insulin sliding scale for glucose cover and monitoring.  If worsening anion gap may need iv insulin   Chronic back pain Continue pain control with oxycodone and Iv  morphine  Class 1 obesity Calculated BMI is 34,7     Consultants: Nephrology.  Gastroenterology Procedures performed: None Disposition: Home Diet recommendation:  Discharge Diet Orders (From admission, onward)     Start     Ordered   04/23/22 0000  Diet - low sodium heart healthy        04/23/22 1418           Cardiac and Carb modified diet DISCHARGE MEDICATION: Allergies as of 04/23/2022       Reactions   Compazine [prochlorperazine Edisylate]    "Stroke like symptoms"   Reglan [metoclopramide]    "Stroke like symptoms"        Medication List     STOP taking these medications    Ozempic (1 MG/DOSE) 4 MG/3ML Sopn Generic drug: Semaglutide (1 MG/DOSE)       TAKE these medications    amLODipine 10 MG tablet Commonly known as: NORVASC Take 1 tablet (10 mg total) by mouth daily. Start taking on: April 24, 2022   cloNIDine 0.1 mg/24hr patch Commonly known as: CATAPRES - Dosed in mg/24 hr Place 1 patch (0.1 mg total) onto the skin once a week. Start taking on: April 28, 2022   erythromycin 250 MG tablet Commonly known as: E-MYCIN Take 1 tablet (250 mg total) by mouth 2 (two) times daily for 5 days.   insulin lispro 100 UNIT/ML KwikPen Commonly known as: HUMALOG Inject 5-8 Units into the skin 3 (three) times daily with meals.   Lantus SoloStar 100 UNIT/ML Solostar Pen Generic drug: insulin glargine Inject 20 Units into the skin at bedtime. What changed: how much to take   pantoprazole 40 MG tablet Commonly known as: Protonix Take 1 tablet (40 mg total) by mouth daily.   promethazine 12.5 MG tablet Commonly known as: PHENERGAN Take 1 tablet (12.5 mg total) by mouth every 6 (six) hours as needed for nausea or vomiting.        Follow-up Information     Randa Lynn, MD. Schedule an appointment as soon as possible for a visit in 2 day(s).   Specialty: Nephrology Contact information: [address] [place]  [address] [phone]                Discharge Exam: Ceasar Mons Weights   04/19/22 1505  Weight: 104.3 kg   General.  Obese lady, in no acute distress. Pulmonary.  Lungs clear bilaterally, normal respiratory effort. CV.  Regular rate and rhythm, no JVD, rub or murmur. Abdomen.  Soft, nontender, nondistended, BS positive. CNS.  Alert and oriented .  No focal neurologic deficit. Extremities.  No edema, no cyanosis, pulses intact and symmetrical. Psychiatry.  Judgment and insight appears normal.   Condition at discharge: stable  The results of significant diagnostics from this hospitalization (including imaging, microbiology, ancillary and laboratory) are listed below for reference.   Imaging Studies: US RENAL  Result Date: 04/20/2022 CLINICAL DATA:  Acute kidney injury EXAM: RENAL / URINARY TRACT ULTRASOUND COMPLETE COMPARISON:  Noncontrast CT yesterday FINDINGS: Right Kidney: Renal measurements: 9.3 x 4.6 x 5.4 cm = volume: 121 mL. Normal parenchymal echogenicity. No hydronephrosis. No visualized stone or focal lesion. Left Kidney: Renal  measurements: 10.5 x 5.1 x 4.5 cm = volume: 128 mL. Normal parenchymal echogenicity. No hydronephrosis. No visualized stone or focal lesion. Bladder: Moderately distended, volume = 530 cm^3. Appears normal for degree of bladder distention. Other: Incidental increased hepatic echogenicity typical of steatosis. IMPRESSION: 1. Normal sonographic appearance of the kidneys. 2. Moderately distended urinary bladder, normal for degree of distension. Electronically Signed   By: Narda Rutherford M.D.   On: 04/20/2022 16:05   CT ABDOMEN PELVIS WO CONTRAST  Result Date: 04/19/2022 CLINICAL DATA:  Acute generalized abdominal pain. EXAM: CT ABDOMEN AND PELVIS WITHOUT CONTRAST TECHNIQUE: Multidetector CT imaging of the abdomen and pelvis was performed following the standard protocol without IV contrast. RADIATION DOSE REDUCTION: This exam was performed according to the  departmental dose-optimization program which includes automated exposure control, adjustment of the mA and/or kV according to patient size and/or use of iterative reconstruction technique. COMPARISON:  November 18, 2021. FINDINGS: Lower chest: No acute abnormality. Hepatobiliary: Mild cholelithiasis is noted. No biliary dilatation is noted. Hepatic steatosis. Pancreas: Unremarkable. No pancreatic ductal dilatation or surrounding inflammatory changes. Spleen: Normal in size without focal abnormality. Adrenals/Urinary Tract: Adrenal glands are unremarkable. Kidneys are normal, without renal calculi, focal lesion, or hydronephrosis. Bladder is unremarkable. Stomach/Bowel: Stomach is within normal limits. Appendix appears normal. No evidence of bowel wall thickening, distention, or inflammatory changes. Vascular/Lymphatic: No significant vascular findings are present. No enlarged abdominal or pelvic lymph nodes. Reproductive: Uterus and bilateral adnexa are unremarkable. Other: No abdominal wall hernia or abnormality. No abdominopelvic ascites. Musculoskeletal: No acute or significant osseous findings. IMPRESSION: Hepatic steatosis. Mild cholelithiasis without evidence of inflammation. Electronically Signed   By: Lupita Raider M.D.   On: 04/19/2022 12:49   DG Chest Port 1 View  Result Date: 04/19/2022 CLINICAL DATA:  Abdominal pain EXAM: PORTABLE CHEST 1 VIEW COMPARISON:  CT 08/03/2018 FINDINGS: Heart size and mediastinal contours are unremarkable. Lung volumes are low. No pleural fluid, interstitial edema or airspace disease. The visualized osseous structures are unremarkable. IMPRESSION: 1. Low lung volumes. 2. No acute findings. Electronically Signed   By: Signa Kell M.D.   On: 04/19/2022 11:29    Microbiology: Results for orders placed or performed in visit on 08/11/20  SARS Coronavirus 2 (TAT 6-24 hrs)     Status: None   Collection Time: 08/11/20 12:00 AM  Result Value Ref Range Status   SARS  Coronavirus 2 RESULT: NEGATIVE  Final    Comment: RESULT: NEGATIVESARS-CoV-2 INTERPRETATION:A NEGATIVE  test result means that SARS-CoV-2 RNA was not present in the specimen above the limit of detection of this test. This does not preclude a possible SARS-CoV-2 infection and should not be used as the  sole basis for patient management decisions. Negative results must be combined with clinical observations, patient history, and epidemiological information. Optimum specimen types and timing for peak viral levels during infections caused by SARS-CoV-2  have not been determined. Collection of multiple specimens or types of specimens may be necessary to detect virus. Improper specimen collection and handling, sequence variability under primers/probes, or organism present below the limit of detection may  lead to false negative results. Positive and negative predictive values of testing are highly dependent on prevalence. False negative test results are more likely when prevalence of disease is high.The expected result is NEGATIVE.Fact S heet for  Healthcare Providers: [employer] Sheet for Patients: https://poole-freeman.org/ Reference Range - Negative     Labs: CBC: Recent Labs  Lab 04/19/22 0943 04/19/22 1013 04/20/22 0555 04/21/22  0404 04/22/22 0533  WBC 10.8*  --  11.4* 12.8* 9.1  NEUTROABS 9.2*  --   --   --   --   HGB 12.7 12.6 12.6 13.8 13.1  HCT 36.3 37.0 37.3 41.0 40.3  MCV 82.3  --  84.8 85.6 87.8  PLT 339  --  318 330 283   Basic Metabolic Panel: Recent Labs  Lab 04/19/22 2209 04/20/22 0555 04/21/22 0404 04/22/22 0533 04/23/22 0448  NA 142 140 140 140 133*  K 4.4 4.1 3.7 4.0 3.2*  CL 108 111 109 109 107  CO2 20* 20* 21* 22 22  GLUCOSE 369* 343* 245* 201* 145*  BUN 38* 40* 37* 36* 34*  CREATININE 3.31* 3.27* 3.33* 3.60* 3.30*  CALCIUM 9.1 8.6* 8.6* 8.2* 7.5*  MG  --   --  1.9 2.0  --   PHOS  --   --  3.0 3.1 3.3    Liver Function Tests: Recent Labs  Lab 04/19/22 0943 04/22/22 0533 04/23/22 0448 04/23/22 0449  AST 17 63*  --  17  ALT 21 54*  --  33  ALKPHOS 212* 176*  --  156*  BILITOT 0.9 0.3  --  0.2*  PROT 8.5* 6.9  --  5.7*  ALBUMIN 4.1 3.2* 2.6* 2.6*   CBG: Recent Labs  Lab 04/22/22 1132 04/22/22 1642 04/22/22 2048 04/23/22 0706 04/23/22 1122  GLUCAP 392* 181* 206* 138* 271*    Discharge time spent: greater than 30 minutes.  This record has been created using Conservation officer, historic buildings. Errors have been sought and corrected,but may not always be located. Such creation errors do not reflect on the standard of care.   Signed: Arnetha Courser, MD [facility] 04/23/2022

## 2022-04-23 NOTE — Progress Notes (Addendum)
[facility] Progress Note  Name: Lindsay Burnett MRN: [id number] DOB: 1990-06-04   Subjective:  Seen in her room this AM.  She felt well but expressed that she hoped she could go home.  She states that she does not have a kidney doctor but that she is being referred to one.  She has been on IV fluids.  Per patient and team her PVR was acceptable on last check  Review of systems:  Denies n/v No chest pain  No shortness of breath   ------------ Background on consult:    Intake/Output Summary (Last 24 hours) at 04/23/2022 1745 Last data filed at 04/23/2022 1100 Gross per 24 hour  Intake 2334.5 ml  Output 1000 ml  Net 1334.5 ml    Vitals:  Vitals:   04/22/22 2148 04/23/22 0410 04/23/22 0847 04/23/22 1222  BP: (!) 102/56 116/76 124/87 99/60  Pulse: 97 91 86 89  Resp: Temp:  97.6 F (36.4 C)  98.6 F (37 C)  TempSrc:      SpO2: 100% 100%  100%  Weight:      Height:         Physical Exam:  General adult female in bed in no acute distress HEENT normocephalic atraumatic extraocular movements intact sclera anicteric Neck supple trachea midline Lungs clear to auscultation bilaterally normal work of breathing at rest  Heart S1S2 no rub Abdomen soft nontender nondistended Extremities no edema  Psych normal mood and affect Neuro - alert and oriented x 3 provides hx and follows commands    Medications reviewed   Labs:     Latest Ref Rng & Units 04/23/2022    4:48 AM 04/22/2022    5:33 AM 04/21/2022    4:04 AM  BMP  Glucose 70 - 99 mg/dL 956  213  086   BUN 6 - 20 mg/dL 34  36  37   Creatinine 0.44 - 1.00 mg/dL 5.78  4.69  6.29   Sodium 135 - 145 mmol/L 133  140  140   Potassium 3.5 - 5.1 mmol/L 3.2  4.0  3.7   Chloride 98 - 111 mmol/L 107  109  109   CO2 22 - 32 mmol/L Calcium 8.9 - 10.3 mg/dL 7.5  8.2  8.6      Assessment/Plan:   Assessment/Plan:  AKI/CKD stage IIIb - likely ischemic ATN in setting of volume depletion as  well a component of HTN urgency (unable to keep down her bp meds) +/- obstructive uropathy (Renal US with moderately distended bladder).  Bladder scan was acceptable per primary team.  Continue with IVF's and transition back to home BP meds.  Ordered urine protein/cr ratio however it appears this order was discontinued.  Intractable nausea and vomiting - presumably due to side effects of Ozempic. GI consulted.  Had similar admission in November and Ozempic had been held since discharge until last week.  Antiemetics per primary svc. Distended bladder - likely neurogenic bladder due to DM.  Check post void bladder scan and in/out cath if over 300.  Acceptable last check per primary team and per pt Hypertensive urgency - due to inability to keep down po meds.  Home meds were resumed and would transition back to her home regimen . DM type 2 - per primary svc.  Agree with stopping Ozempic. Chronic back pain - per primary Obesity - BMI 34.   I reached out to the primary team  this AM and recommended continued inpatient monitoring.  Ultimately the patient elected to be discharged home per the primary team.  She will need a follow-up appointment with nephrology to establish care.  She lives in [place] and would prefer to see a provider there.    Estanislado Emms, MD 04/23/2022 5:45 PM  Addendum - see that she has previously seen [facility] - she will need to follow-up with their practice.   Estanislado Emms, MD 5:56 PM 04/23/2022

## 2022-05-12 NOTE — Progress Notes (Deleted)
Referring Provider: No ref. provider found Primary Care Physician:  Patient, No Pcp Per Primary GI Physician: Dr. Levon Hedger  No chief complaint on file.   HPI:   Destiny Gasaway is a 32 y.o. female with history of uncontrolled diabetes complicated by CKD presenting for hospital follow-up.   She was admitted 4/12-4/16 after presenting with acute onset nausea, vomiting, and abdominal pain after restarting Ozempic. Symptoms resolved with supportive measures and erythromycin. Denied any chronic GI symptoms prior. Suspected symptoms were likely secondary to the combination of poorly controlled diabetes and GLP-1 agonist. Recommended discontinuing erythromycin at discharge, continue PPI x 8 weeks. She did have mild bump in LFTs while inpatient. AST and ALT normalixed. Alk phos was improving. No additional work-up pursued.     Past Medical History:  Diagnosis Date   Blood transfusion without reported diagnosis    Diabetes mellitus without complication (HCC)    Pregnancy induced hypertension     Past Surgical History:  Procedure Laterality Date   CESAREAN SECTION     2 cesarean sections   CESAREAN SECTION N/A 08/12/2020   Procedure: CESAREAN SECTION, ADD LILETTA IUD;  Surgeon: Tereso Newcomer, MD;  Location: [facility] LD ORS;  Service: Obstetrics;  Laterality: N/A;    Current Outpatient Medications  Medication Sig Dispense Refill   amLODipine (NORVASC) 10 MG tablet Take 1 tablet (10 mg total) by mouth daily. 30 tablet 1   cloNIDine (CATAPRES - DOSED IN MG/24 HR) 0.1 mg/24hr patch Place 1 patch (0.1 mg total) onto the skin once a week. 4 patch 12   insulin glargine (LANTUS SOLOSTAR) 100 UNIT/ML Solostar Pen Inject 20 Units into the skin at bedtime. 15 mL 11   insulin lispro (HUMALOG) 100 UNIT/ML KwikPen Inject 5-8 Units into the skin 3 (three) times daily with meals.     pantoprazole (PROTONIX) 40 MG tablet Take 1 tablet (40 mg total) by mouth daily. 30 tablet 1   promethazine (PHENERGAN)  12.5 MG tablet Take 1 tablet (12.5 mg total) by mouth every 6 (six) hours as needed for nausea or vomiting. 30 tablet 0   No current facility-administered medications for this visit.    Allergies as of 05/15/2022 - Review Complete 04/19/2022  Allergen Reaction Noted   Compazine [prochlorperazine edisylate]  08/03/2018   Reglan [metoclopramide]  08/03/2018    Family History  Problem Relation Age of Onset   Diabetes Mother    Diabetes Maternal Grandmother     Social History   Socioeconomic History   Marital status: Divorced    Spouse name: Not on file   Number of children: Not on file   Years of education: Not on file   Highest education level: Not on file  Occupational History   Not on file  Tobacco Use   Smoking status: Every Day    Packs/day: 0.50    Years: 4.00    Additional pack years: 0.00    Total pack years: 2.00    Types: Cigarettes   Smokeless tobacco: Never  Vaping Use   Vaping Use: Never used  Substance and Sexual Activity   Alcohol use: Not Currently    Comment: occ   Drug use: Never   Sexual activity: Yes    Birth control/protection: Condom  Other Topics Concern   Not on file  Social History Narrative   Not on file   Social Determinants of Health   Financial Resource Strain: Not on file  Food Insecurity: Not on file  Transportation Needs: Not  on file  Physical Activity: Not on file  Stress: Not on file  Social Connections: Not on file    Review of Systems: Gen: Denies fever, chills, anorexia. Denies fatigue, weakness, weight loss.  CV: Denies chest pain, palpitations, syncope, peripheral edema, and claudication. Resp: Denies dyspnea at rest, cough, wheezing, coughing up blood, and pleurisy. GI: Denies vomiting blood, jaundice, and fecal incontinence.   Denies dysphagia or odynophagia. Derm: Denies rash, itching, dry skin Psych: Denies depression, anxiety, memory loss, confusion. No homicidal or suicidal ideation.  Heme: Denies bruising,  bleeding, and enlarged lymph nodes.  Physical Exam: LMP 04/15/2022  General:   Alert and oriented. No distress noted. Pleasant and cooperative.  Head:  Normocephalic and atraumatic. Eyes:  Conjuctiva clear without scleral icterus. Heart:  S1, S2 present without murmurs appreciated. Lungs:  Clear to auscultation bilaterally. No wheezes, rales, or rhonchi. No distress.  Abdomen:  +BS, soft, non-tender and non-distended. No rebound or guarding. No HSM or masses noted. Msk:  Symmetrical without gross deformities. Normal posture. Extremities:  Without edema. Neurologic:  Alert and  oriented x4 Psych:  Normal mood and affect.    Assessment:     Plan:  ***   Ermalinda Memos, PA-C [facility] 05/15/2022

## 2022-05-15 ENCOUNTER — Telehealth: Payer: Self-pay | Admitting: Orthopedic Surgery

## 2022-05-15 ENCOUNTER — Inpatient Hospital Stay: Payer: Medicaid Other | Admitting: Gastroenterology

## 2022-05-15 ENCOUNTER — Encounter: Payer: Self-pay | Admitting: Gastroenterology

## 2022-05-15 NOTE — Telephone Encounter (Signed)
Returned a call to the patient, she was wanting to know when her appointment was for [place].  She didn't have one.  Before she scheduled she wanted to know if we accept [facility] [place] of [place], advised we do not.  She is going to find someone to see in [place] and/or talk to Gilmore Laroche at her appointment on 05/21/22 to see what she advises.

## 2022-05-21 ENCOUNTER — Ambulatory Visit: Payer: Medicaid Other | Admitting: Family Medicine

## 2022-08-08 ENCOUNTER — Ambulatory Visit: Payer: Self-pay | Admitting: "Endocrinology
